# Patient Record
Sex: Female | Born: 1995 | Race: White | Hispanic: No | Marital: Single | Smoking: Current every day smoker
Health system: Southern US, Community
[De-identification: ages and names within clinical notes are randomized; demographics above are authoritative.]

## PROBLEM LIST (undated history)

## (undated) DIAGNOSIS — S43491A Other sprain of right shoulder joint, initial encounter: Secondary | ICD-10-CM

## (undated) DIAGNOSIS — F909 Attention-deficit hyperactivity disorder, unspecified type: Secondary | ICD-10-CM

## (undated) DIAGNOSIS — G44209 Tension-type headache, unspecified, not intractable: Secondary | ICD-10-CM

## (undated) DIAGNOSIS — Z8619 Personal history of other infectious and parasitic diseases: Secondary | ICD-10-CM

## (undated) DIAGNOSIS — L818 Other specified disorders of pigmentation: Secondary | ICD-10-CM

## (undated) HISTORY — PX: ROTATOR CUFF REPAIR: SHX139

## (undated) HISTORY — PX: SHOULDER ARTHROSCOPY W/ ROTATOR CUFF REPAIR: SHX2400

## (undated) HISTORY — PX: WISDOM TOOTH EXTRACTION: SHX21

---
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
## 2015-01-16 ENCOUNTER — Encounter: Payer: Self-pay | Admitting: Advanced Practice Midwife

## 2015-01-16 NOTE — Progress Notes (Signed)
  Lost her medicaid.  3 samples of Nuva Ring given

## 2015-03-02 ENCOUNTER — Ambulatory Visit: Payer: Medicaid Other | Admitting: Women's Health

## 2015-03-02 ENCOUNTER — Encounter: Payer: Self-pay | Admitting: *Deleted

## 2015-03-17 ENCOUNTER — Ambulatory Visit (INDEPENDENT_AMBULATORY_CARE_PROVIDER_SITE_OTHER): Payer: 59 | Admitting: Women's Health

## 2015-03-17 ENCOUNTER — Encounter: Payer: Self-pay | Admitting: Women's Health

## 2015-03-17 VITALS — BP 102/68 | HR 74 | Ht 62.0 in | Wt 186.0 lb

## 2015-03-17 DIAGNOSIS — Z308 Encounter for other contraceptive management: Secondary | ICD-10-CM

## 2015-03-17 DIAGNOSIS — Z3202 Encounter for pregnancy test, result negative: Secondary | ICD-10-CM | POA: Diagnosis not present

## 2015-03-17 DIAGNOSIS — Z3009 Encounter for other general counseling and advice on contraception: Secondary | ICD-10-CM

## 2015-03-17 DIAGNOSIS — Z30017 Encounter for initial prescription of implantable subdermal contraceptive: Secondary | ICD-10-CM

## 2015-03-17 LAB — POCT URINE PREGNANCY: PREG TEST UR: NEGATIVE

## 2015-03-17 NOTE — Patient Instructions (Signed)
NO SEX AT ALL UNTIL YOUR NEXPLANON IS PLACED COME 4/7 AM FOR BLOODWORK ACROSS THE HALL AT [facility], THEN IN THE AFTERNOON TO HAVE YOUR NEXPLANON PUT IN  Etonogestrel implant- NEXPLANON What is this medicine? ETONOGESTREL (et oh noe JES trel) is a contraceptive (birth control) device. It is used to prevent pregnancy. It can be used for up to 3 years. This medicine may be used for other purposes; ask your health care provider or pharmacist if you have questions. COMMON BRAND NAME(S): Implanon, Nexplanon What should I tell my health care provider before I take this medicine? They need to know if you have any of these conditions: -abnormal vaginal bleeding -blood vessel disease or blood clots -cancer of the breast, cervix, or liver -depression -diabetes -gallbladder disease -headaches -heart disease or recent heart attack -high blood pressure -high cholesterol -kidney disease -liver disease -renal disease -seizures -tobacco smoker -an unusual or allergic reaction to etonogestrel, other hormones, anesthetics or antiseptics, medicines, foods, dyes, or preservatives -pregnant or trying to get pregnant -breast-feeding How should I use this medicine? This device is inserted just under the skin on the inner side of your upper arm by a health care professional. Talk to your pediatrician regarding the use of this medicine in children. Special care may be needed. Overdosage: If you think you've taken too much of this medicine contact a poison control center or emergency room at once. Overdosage: If you think you have taken too much of this medicine contact a poison control center or emergency room at once. NOTE: This medicine is only for you. Do not share this medicine with others. What if I miss a dose? This does not apply. What may interact with this medicine? Do not take this medicine with any of the following medications: -amprenavir -bosentan -fosamprenavir This medicine may also  interact with the following medications: -barbiturate medicines for inducing sleep or treating seizures -certain medicines for fungal infections like ketoconazole and itraconazole -griseofulvin -medicines to treat seizures like carbamazepine, felbamate, oxcarbazepine, phenytoin, topiramate -modafinil -phenylbutazone -rifampin -some medicines to treat HIV infection like atazanavir, indinavir, lopinavir, nelfinavir, tipranavir, ritonavir -St. John's wort This list may not describe all possible interactions. Give your health care provider a list of all the medicines, herbs, non-prescription drugs, or dietary supplements you use. Also tell them if you smoke, drink alcohol, or use illegal drugs. Some items may interact with your medicine. What should I watch for while using this medicine? This product does not protect you against HIV infection (AIDS) or other sexually transmitted diseases. You should be able to feel the implant by pressing your fingertips over the skin where it was inserted. Tell your doctor if you cannot feel the implant. What side effects may I notice from receiving this medicine? Side effects that you should report to your doctor or health care professional as soon as possible: -allergic reactions like skin rash, itching or hives, swelling of the face, lips, or tongue -breast lumps -changes in vision -confusion, trouble speaking or understanding -dark urine -depressed mood -general ill feeling or flu-like symptoms -light-colored stools -loss of appetite, nausea -right upper belly pain -severe headaches -severe pain, swelling, or tenderness in the abdomen -shortness of breath, chest pain, swelling in a leg -signs of pregnancy -sudden numbness or weakness of the face, arm or leg -trouble walking, dizziness, loss of balance or coordination -unusual vaginal bleeding, discharge -unusually weak or tired -yellowing of the eyes or skin Side effects that usually do not  require medical attention (Report  these to your doctor or health care professional if they continue or are bothersome.): -acne -breast pain -changes in weight -cough -fever or chills -headache -irregular menstrual bleeding -itching, burning, and vaginal discharge -pain or difficulty passing urine -sore throat This list may not describe all possible side effects. Call your doctor for medical advice about side effects. You may report side effects to FDA at 1-800-FDA-1088. Where should I keep my medicine? This drug is given in a hospital or clinic and will not be stored at home. NOTE: This sheet is a summary. It may not cover all possible information. If you have questions about this medicine, talk to your doctor, pharmacist, or health care provider.  2015, Elsevier/Gold Standard. (2012-06-10 15:37:45)

## 2015-03-17 NOTE — Progress Notes (Signed)
Patient ID: Kristen Holmes Emily, female   DOB: 06-Feb-1996, 19 y.o.   MRN: [id number]   [facility]  Patient name: Kristen Holmes Kerman MRN [id number]  Date of birth: 06-Feb-1996  CC & HPI:  Kristen Holmes Hedeen is a 19 y.o. Caucasian female presenting today for nexplanon insertion. However, she had sex 2 days ago. She does have nuva ring in but has not been taking out/putting new one in on schedule like she is supposed to. Patient's last menstrual period was 02/11/2015 (approximate).   Pertinent History Reviewed:  Medical & Surgical Hx:   Past Medical History  Diagnosis Date  . Depression   . Bipolar 1 disorder   . ADHD (attention deficit hyperactivity disorder)   . Gonorrhea   . Chlamydia   . Chlamydia infection 12/03/2014  . GC (gonococcus infection) 12/03/2014  . HPV (human papilloma virus) infection    Past Surgical History  Procedure Laterality Date  . Wisdom tooth extraction     Medications: Reviewed & Updated - see associated section Social History: Reviewed -  reports that she has been smoking Cigarettes.  She has been smoking about 0.50 packs per day. She has never used smokeless tobacco.  Objective Findings:  Vitals: BP 102/68 mmHg  Pulse 74  Ht 5\' 2"  (1.575 m)  Wt 186 lb (84.369 kg)  BMI 34.01 kg/m2  LMP 02/11/2015 (Approximate)  Physical Examination: General appearance - alert, well appearing, and in no distress  Results for orders placed or performed in visit on 03/17/15 (from the past 24 hour(s))  POCT urine pregnancy   Collection Time: 03/17/15 11:40 AM  Result Value Ref Range   Preg Test, Ur Negative      Assessment & Plan:  A:   Contraception counseling P:  Abstinence until nexplanon placment   F/U 4/7 for [facility] am and nexplanon placement pm   Marge DuncansBooker, Serenidy Waltz Randall CNM, [facility] LLCWHNP-BC 03/17/2015 11:56 AM

## 2015-03-25 ENCOUNTER — Other Ambulatory Visit: Payer: 59

## 2015-03-25 ENCOUNTER — Encounter: Payer: Self-pay | Admitting: Advanced Practice Midwife

## 2015-03-25 ENCOUNTER — Encounter: Payer: 59 | Admitting: Advanced Practice Midwife

## 2015-03-25 ENCOUNTER — Ambulatory Visit (INDEPENDENT_AMBULATORY_CARE_PROVIDER_SITE_OTHER): Payer: 59 | Admitting: Advanced Practice Midwife

## 2015-03-25 DIAGNOSIS — Z3202 Encounter for pregnancy test, result negative: Secondary | ICD-10-CM | POA: Diagnosis not present

## 2015-03-25 DIAGNOSIS — Z3049 Encounter for surveillance of other contraceptives: Secondary | ICD-10-CM | POA: Diagnosis not present

## 2015-03-25 DIAGNOSIS — Z113 Encounter for screening for infections with a predominantly sexual mode of transmission: Secondary | ICD-10-CM

## 2015-03-25 DIAGNOSIS — Z30018 Encounter for initial prescription of other contraceptives: Secondary | ICD-10-CM | POA: Diagnosis not present

## 2015-03-25 DIAGNOSIS — Z3200 Encounter for pregnancy test, result unknown: Secondary | ICD-10-CM

## 2015-03-25 DIAGNOSIS — Z30017 Encounter for initial prescription of implantable subdermal contraceptive: Secondary | ICD-10-CM

## 2015-03-25 LAB — POCT URINE PREGNANCY: PREG TEST UR: NEGATIVE

## 2015-03-25 NOTE — Addendum Note (Signed)
Addended by: Richardson ChiquitoRAVIS, Donyell Ding M on: 03/25/2015 03:38 PM   Modules accepted: Orders

## 2015-03-25 NOTE — Progress Notes (Signed)
  HPI:  Kristen Holmes is a 19 y.o. year old Caucasian female here for Nexplanon insertion.  Her LMP was one month ago. She has been on nuva ring this month, had intercourse with a condom 2 weeks ago, and her pregnancy test today was negative.  Risks/benefits/side effects of Nexplanon have been discussed and her questions have been answered.  Specifically, a failure rate of 12/998 has been reported, with an increased failure rate if pt takes St. John's Wort and/or antiseizure medicaitons.  Kristen Holmes is aware of the common side effect of irregular bleeding, which the incidence of decreases over time. She requests "all" STD testing today.   Past Medical History: Past Medical History  Diagnosis Date  . Depression   . Bipolar 1 disorder   . ADHD (attention deficit hyperactivity disorder)   . Gonorrhea   . Chlamydia   . Chlamydia infection 12/03/2014  . GC (gonococcus infection) 12/03/2014  . HPV (human papilloma virus) infection     Past Surgical History: Past Surgical History  Procedure Laterality Date  . Wisdom tooth extraction      Family History: Family History  Problem Relation Age of Onset  . Depression Mother   . Alcohol abuse Maternal Aunt   . Alcohol abuse Paternal Aunt   . Drug abuse Paternal Aunt   . Mental illness Paternal Uncle   . Diabetes Maternal Grandmother     Social History: History  Substance Use Topics  . Smoking status: Current Every Day Smoker -- 0.50 packs/day    Types: Cigarettes  . Smokeless tobacco: Never Used  . Alcohol Use: Yes     Comment: Occasionally    Allergies:  Allergies  Allergen Reactions  . Aripiprazole Anxiety    Causes restlessness  . Trazodone And Nefazodone Anxiety    Causes restlessness      Her left arm, approximatly 4 inches proximal from the elbow, was cleansed with alcohol and anesthetized with 2cc of 2% Lidocaine.  The area was cleansed again and the Nexplanon was inserted without difficulty.  A pressure bandage  was applied.  Pt was instructed to remove pressure bandage in a few hours, and keep insertion site covered with a bandaid for 3 days.  Back up contraception was recommended for 2 weeks.(and condoms for STD protection always).  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

## 2015-03-26 LAB — HSV 2 ANTIBODY, IGG: HSV 2 Glycoprotein G Ab, IgG: <0.91 index (ref 0.00–0.90)

## 2015-03-26 LAB — RPR: RPR Ser Ql: NONREACTIVE

## 2015-03-26 LAB — GC/CHLAMYDIA PROBE AMP
Chlamydia trachomatis, NAA: NEGATIVE
Neisseria gonorrhoeae by PCR: NEGATIVE

## 2015-03-26 LAB — HIV ANTIBODY (ROUTINE TESTING W REFLEX): HIV SCREEN 4TH GENERATION: NONREACTIVE

## 2015-03-28 LAB — TRICHOMONAS VAGINALIS, PROBE AMP: TRICH VAG BY NAA: NEGATIVE

## 2015-04-13 ENCOUNTER — Emergency Department (HOSPITAL_COMMUNITY): Payer: 59

## 2015-04-13 ENCOUNTER — Emergency Department (HOSPITAL_COMMUNITY)
Admission: EM | Admit: 2015-04-13 | Discharge: 2015-04-13 | Disposition: A | Discharge status: Home / Self Care | Payer: 59 | Attending: Emergency Medicine | Admitting: Emergency Medicine

## 2015-04-13 ENCOUNTER — Encounter (HOSPITAL_COMMUNITY): Payer: Self-pay

## 2015-04-13 DIAGNOSIS — Y9289 Other specified places as the place of occurrence of the external cause: Secondary | ICD-10-CM | POA: Insufficient documentation

## 2015-04-13 DIAGNOSIS — S43015A Anterior dislocation of left humerus, initial encounter: Secondary | ICD-10-CM | POA: Insufficient documentation

## 2015-04-13 DIAGNOSIS — Y998 Other external cause status: Secondary | ICD-10-CM | POA: Insufficient documentation

## 2015-04-13 DIAGNOSIS — X58XXXA Exposure to other specified factors, initial encounter: Secondary | ICD-10-CM | POA: Insufficient documentation

## 2015-04-13 DIAGNOSIS — S43004A Unspecified dislocation of right shoulder joint, initial encounter: Secondary | ICD-10-CM

## 2015-04-13 DIAGNOSIS — Z8619 Personal history of other infectious and parasitic diseases: Secondary | ICD-10-CM | POA: Diagnosis not present

## 2015-04-13 DIAGNOSIS — Z8659 Personal history of other mental and behavioral disorders: Secondary | ICD-10-CM | POA: Diagnosis not present

## 2015-04-13 DIAGNOSIS — S4991XA Unspecified injury of right shoulder and upper arm, initial encounter: Secondary | ICD-10-CM | POA: Diagnosis present

## 2015-04-13 DIAGNOSIS — Z79899 Other long term (current) drug therapy: Secondary | ICD-10-CM | POA: Diagnosis not present

## 2015-04-13 DIAGNOSIS — Y9389 Activity, other specified: Secondary | ICD-10-CM | POA: Insufficient documentation

## 2015-04-13 DIAGNOSIS — Z720 Tobacco use: Secondary | ICD-10-CM | POA: Insufficient documentation

## 2015-04-13 MED ORDER — ETOMIDATE 2 MG/ML IV SOLN
12.0000 mg | Freq: Once | INTRAVENOUS | Status: AC
  Administered 2015-04-13: 12 mg via INTRAVENOUS
  Filled 2015-04-13: qty 10

## 2015-04-13 MED ORDER — HYDROMORPHONE HCL 1 MG/ML IJ SOLN
1.0000 mg | Freq: Once | INTRAMUSCULAR | Status: AC
  Administered 2015-04-13: 1 mg via INTRAVENOUS
  Filled 2015-04-13: qty 1

## 2015-04-13 MED ORDER — ONDANSETRON HCL 4 MG/2ML IJ SOLN
4.0000 mg | Freq: Once | INTRAMUSCULAR | Status: AC
  Administered 2015-04-13: 4 mg via INTRAVENOUS
  Filled 2015-04-13: qty 2

## 2015-04-13 NOTE — ED Provider Notes (Signed)
CSN: [id number]     Arrival date & time 04/13/15  1634 History   First MD Initiated Contact with Patient 04/13/15 1645     Chief Complaint  Patient presents with  . Shoulder Pain     (Consider location/radiation/quality/duration/timing/severity/associated sxs/prior Treatment) Patient is a 19 y.o. female presenting with shoulder pain. The history is provided by the patient (the pt moved her right shoulder and it came out of scoket.  this has happened before).  Shoulder Pain Location:  Shoulder (pain in right shoulder) Shoulder location:  R shoulder Pain details:    Quality:  Aching   Radiates to:  Does not radiate   Severity:  Moderate   Onset quality:  Sudden   Timing:  Constant Chronicity:  Recurrent Associated symptoms: no back pain and no fatigue     Past Medical History  Diagnosis Date  . Depression   . Bipolar 1 disorder   . ADHD (attention deficit hyperactivity disorder)   . Gonorrhea   . Chlamydia   . Chlamydia infection 12/03/2014  . GC (gonococcus infection) 12/03/2014  . HPV (human papilloma virus) infection    Past Surgical History  Procedure Laterality Date  . Wisdom tooth extraction     Family History  Problem Relation Age of Onset  . Depression Mother   . Alcohol abuse Maternal Aunt   . Alcohol abuse Paternal Aunt   . Drug abuse Paternal Aunt   . Mental illness Paternal Uncle   . Diabetes Maternal Grandmother    History  Substance Use Topics  . Smoking status: Current Every Day Smoker -- 0.50 packs/day    Types: Cigarettes  . Smokeless tobacco: Never Used  . Alcohol Use: Yes     Comment: Occasionally   OB History    No data available     Review of Systems  Constitutional: Negative for appetite change and fatigue.  HENT: Negative for congestion, ear discharge and sinus pressure.   Eyes: Negative for discharge.  Respiratory: Negative for cough.   Cardiovascular: Negative for chest pain.  Gastrointestinal: Negative for abdominal pain and  diarrhea.  Genitourinary: Negative for frequency and hematuria.  Musculoskeletal: Negative for back pain.       Shoulder pain  Skin: Negative for rash.  Neurological: Negative for seizures and headaches.  Psychiatric/Behavioral: Negative for hallucinations.      Allergies  Aripiprazole and Trazodone and nefazodone  Home Medications   Prior to Admission medications   Medication Sig Start Date End Date Taking? Authorizing Provider  etonogestrel (NEXPLANON) 68 MG IMPL implant 1 each by Subdermal route once.   Yes Historical Provider, MD  phentermine (ADIPEX-P) 37.5 MG tablet Take 37.5 mg by mouth daily before breakfast.   Yes Historical Provider, MD  azithromycin (ZITHROMAX) 500 MG tablet Take 2 po now Patient not taking: Reported on 03/17/2015 12/03/14   Adline Potter, NP  fluconazole (DIFLUCAN) 150 MG tablet 1 po stat; repeat in 3 days Patient not taking: Reported on 03/17/2015 12/13/14   Jacklyn Shell, CNM   BP 103/89 mmHg  Pulse 77  Temp(Src) 98.5 F (36.9 C) (Oral)  Resp 17  Ht  (1.575 m)  Wt 179 lb 8 oz (81.421 kg)  BMI 32.82 kg/m2  SpO2 100%  LMP 02/11/2015 (Approximate) Physical Exam  Constitutional: She is oriented to person, place, and time. She appears well-developed.  HENT:  Head: Normocephalic.  Eyes: Conjunctivae and EOM are normal. No scleral icterus.  Neck: Neck supple. No thyromegaly present.  Cardiovascular: Normal  rate and regular rhythm.  Exam reveals no gallop and no friction rub.   No murmur heard. Pulmonary/Chest: No stridor. She has no wheezes. She has no rales. She exhibits no tenderness.  Abdominal: She exhibits no distension. There is no tenderness. There is no rebound.  Musculoskeletal: She exhibits no edema.  Deformed tender shoulder  Lymphadenopathy:    She has no cervical adenopathy.  Neurological: She is oriented to person, place, and time. She exhibits normal muscle tone. Coordination normal.  Skin: No rash noted. No  erythema.  Psychiatric: She has a normal mood and affect. Her behavior is normal.    ED Course  Procedures (including critical care time) Labs Review Labs Reviewed - No data to display  Imaging Review Dg Shoulder Right  04/13/2015   CLINICAL DATA:  Pain with history of anterior shoulder dislocations  EXAM: RIGHT SHOULDER - 2+ VIEW  COMPARISON:  October 23, 2014 and October 15, 2013  FINDINGS: Frontal and Y scapular images show anterior dislocation, subcoracoid. No fracture apparent. No appreciable arthropathic change.  IMPRESSION: Anterior dislocation.   Electronically Signed   By: Bretta BangWilliam  Woodruff III M.D.   On: 04/13/2015 17:55   Dg Shoulder Right Port  04/13/2015   CLINICAL DATA:  Post reduction from anterior dislocation  EXAM: PORTABLE RIGHT SHOULDER - 2+ VIEW  COMPARISON:  Study obtained earlier in the day  FINDINGS: Frontal and Y scapular images were obtained. The previously noted anterior dislocation has been reduced successfully. No fracture or dislocation seen currently. No appreciable arthropathy.  IMPRESSION: Successful reduction of anterior dislocation. Currently no fracture or dislocation.   Electronically Signed   By: Bretta BangWilliam  Woodruff III M.D.   On: 04/13/2015 19:54     EKG Interpretation None     Procedure.   Etomidate used for concious sedation.  No complications.   Right shoulder dislocation reduced without problems MDM   Final diagnoses:  Dislocated shoulder, right, initial encounter        Bethann BerkshireJoseph Ramir Malerba, MD 04/13/15 2015

## 2015-04-13 NOTE — ED Notes (Signed)
Pts sedation has ended. VSS. Right arm is in a sling. Neurovascular check WNL. Pt verbalizes discharge instuctions. Pt is ambulatory.

## 2015-04-13 NOTE — ED Notes (Signed)
Pt reports has problems with r shoulder becoming dislocated.  Reports was pushing herself up off of the couch and felt r shoulder pop.  C/O Pain.  EMS applied sling.

## 2015-04-13 NOTE — ED Notes (Signed)
X-ray at bedside. Pt family at bedside.

## 2015-04-13 NOTE — Discharge Instructions (Signed)
Follow up with dr. Romeo AppleHarrison in 2-3 days.  Tylenol or motrin for pain

## 2015-04-13 NOTE — ED Notes (Signed)
Pt left ED, ambulatory with no signs of distress. She verbalized discharge instructions. 

## 2015-04-14 ENCOUNTER — Telehealth: Payer: Self-pay | Admitting: Orthopedic Surgery

## 2015-04-14 NOTE — Telephone Encounter (Signed)
Patient called requesting an appointment for an [facility] ER FU with Dr. Romeo AppleHarrison for right shoulder dislocation. [facility] which requires a PCP Referral and patient states she does not have a PCP and I explained to her and her mother both that [facility] her. I told mom and daughter both that as soon as they have insurance referral we would be glad to schedule appointment. Mother was not happy with this news but I tried to explain to her that is the type of [employer] she as signed up for.

## 2015-04-21 NOTE — Telephone Encounter (Signed)
Appointment scheduled, patient aware, referrals in processing.

## 2015-05-03 ENCOUNTER — Encounter: Payer: Self-pay | Admitting: Orthopedic Surgery

## 2015-05-03 ENCOUNTER — Ambulatory Visit (INDEPENDENT_AMBULATORY_CARE_PROVIDER_SITE_OTHER): Payer: 59 | Admitting: Orthopedic Surgery

## 2015-05-03 VITALS — BP 107/66 | Ht 62.0 in | Wt 179.0 lb

## 2015-05-03 DIAGNOSIS — M24411 Recurrent dislocation, right shoulder: Secondary | ICD-10-CM

## 2015-05-03 NOTE — Progress Notes (Signed)
Patient ID: Kristen Holmes, female   DOB: 06/05/1996, 19 y.o.   MRN: [id number]  Chief Complaint  Patient presents with  . Follow-up    er follow up Right shoulder dislocation, DOI 04/13/15     Kristen Holmes is a 19 y.o. female.   HPI This is an 19 year old female presents with a history of 67 dislocation of the right shoulder. He now goes out easily. She had a closed reduction back in April. She comes in for evaluation and treatment complaining of mild shoulder discomfort. She has worn a sling for her injuries up to 3 weeks' time she's never had physical therapy or MRI. Her dislocating position his abduction external rotation or pushing herself up out of a chair. She denies symptoms on the left side. She denies numbness or tingling. Healthy system review negative Review of Systems Negative  Past Medical History  Diagnosis Date  . Depression   . Bipolar 1 disorder   . ADHD (attention deficit hyperactivity disorder)   . Gonorrhea   . Chlamydia   . Chlamydia infection 12/03/2014  . GC (gonococcus infection) 12/03/2014  . HPV (human papilloma virus) infection     Past Surgical History  Procedure Laterality Date  . Wisdom tooth extraction      Family History  Problem Relation Age of Onset  . Depression Mother   . Alcohol abuse Maternal Aunt   . Alcohol abuse Paternal Aunt   . Drug abuse Paternal Aunt   . Mental illness Paternal Uncle   . Diabetes Maternal Grandmother     Social History History  Substance Use Topics  . Smoking status: Current Every Day Smoker -- 0.50 packs/day    Types: Cigarettes  . Smokeless tobacco: Never Used  . Alcohol Use: Yes     Comment: Occasionally    Allergies  Allergen Reactions  . Aripiprazole Anxiety    Causes restlessness  . Trazodone And Nefazodone Anxiety    Causes restlessness    Current Outpatient Prescriptions  Medication Sig Dispense Refill  . etonogestrel (NEXPLANON) 68 MG IMPL implant 1 each by Subdermal route once.      No current facility-administered medications for this visit.       Physical Exam Blood pressure 107/66, height 5\' 2"  (1.575 m), weight 179 lb (81.194 kg). Physical Exam The patient is well developed well nourished and well groomed. Orientation to person place and time is normal  Mood is pleasant. Ambulatory status normal Left shoulder exhibits inferior subluxation negative apprehension normal range of motion normal rotator cuff strength scans normal neurologic and vascular exam normal. Flexing the wrist or thumb reaches the forearm she has ligament laxity  Right shoulder is difficult for examination she has severe apprehension abduction external rotation. She has inferior sulcus sign. Neurovascular exam is intact skin is normal rotator cuff strength is normal range of motion assessment deferred  Data Reviewed Previous imaging studies show no Hill-Sachs lesion  Assessment Recurrent shoulder dislocation Plan MRI right shoulder with contrast imaging 2 plan surgical reconstruction

## 2015-05-03 NOTE — Patient Instructions (Signed)
Ref[place] to [facility]ist in [place].  [facility] at [facility].

## 2015-05-19 ENCOUNTER — Ambulatory Visit (HOSPITAL_COMMUNITY)
Admission: RE | Admit: 2015-05-19 | Discharge: 2015-05-19 | Disposition: A | Discharge status: Home / Self Care | Payer: 59 | Source: Ambulatory Visit | Attending: Orthopedic Surgery | Admitting: Orthopedic Surgery

## 2015-05-19 ENCOUNTER — Other Ambulatory Visit: Payer: Self-pay | Admitting: Orthopedic Surgery

## 2015-05-19 ENCOUNTER — Encounter (HOSPITAL_COMMUNITY): Payer: Self-pay

## 2015-05-19 DIAGNOSIS — M24411 Recurrent dislocation, right shoulder: Secondary | ICD-10-CM

## 2015-05-19 MED ORDER — GADOBENATE DIMEGLUMINE 529 MG/ML IV SOLN
5.0000 mL | Freq: Once | INTRAVENOUS | Status: AC | PRN
  Administered 2015-05-19: 0.05 mL via INTRAVENOUS

## 2015-05-19 MED ORDER — IOHEXOL 300 MG/ML  SOLN
50.0000 mL | Freq: Once | INTRAMUSCULAR | Status: AC | PRN
  Administered 2015-05-19: 9 mL via INTRAVENOUS

## 2015-05-19 MED ORDER — LIDOCAINE HCL (PF) 1 % IJ SOLN
INTRAMUSCULAR | Status: AC
  Filled 2015-05-19: qty 5

## 2015-05-24 ENCOUNTER — Telehealth: Payer: Self-pay | Admitting: Orthopedic Surgery

## 2015-05-24 NOTE — Telephone Encounter (Signed)
error 

## 2015-05-25 ENCOUNTER — Telehealth: Payer: Self-pay | Admitting: *Deleted

## 2015-05-25 NOTE — Telephone Encounter (Signed)
DR Romeo AppleHARRISON RECOMMENDS REFERRAL TO SHOULDER SPECIALIST DR LANDAU   PATIENTS INSURANCE REQUIRES REFERRAL FROM PCP  FAXED RECOMMENDATION WITH OFFICE NOTES TO PCP

## 2015-05-26 ENCOUNTER — Encounter: Payer: Self-pay | Admitting: *Deleted

## 2015-05-26 NOTE — Telephone Encounter (Signed)
This encounter was created in error - please disregard.

## 2015-06-25 ENCOUNTER — Encounter: Payer: 59 | Admitting: Adult Health

## 2015-07-08 ENCOUNTER — Other Ambulatory Visit: Payer: Self-pay | Admitting: Advanced Practice Midwife

## 2015-07-08 MED ORDER — CIPROFLOXACIN-DEXAMETHASONE 0.3-0.1 % OT SUSP: 4.0000 [drp] | Freq: Two times a day (BID) | OTIC | Status: DC

## 2015-07-08 NOTE — Progress Notes (Signed)
Ear pain. Hx ear infections.  Rx ciprodex

## 2015-07-19 DIAGNOSIS — S43431A Superior glenoid labrum lesion of right shoulder, initial encounter: Secondary | ICD-10-CM

## 2015-07-19 DIAGNOSIS — S43491A Other sprain of right shoulder joint, initial encounter: Secondary | ICD-10-CM

## 2015-07-19 HISTORY — DX: Superior glenoid labrum lesion of right shoulder, initial encounter: S43.431A

## 2015-07-19 HISTORY — DX: Other sprain of right shoulder joint, initial encounter: S43.491A

## 2015-07-20 ENCOUNTER — Encounter: Payer: Self-pay | Admitting: Adult Health

## 2015-07-20 ENCOUNTER — Ambulatory Visit (INDEPENDENT_AMBULATORY_CARE_PROVIDER_SITE_OTHER): Payer: Medicaid Other | Admitting: Adult Health

## 2015-07-20 VITALS — BP 120/80 | HR 80 | Ht 63.0 in | Wt 194.5 lb

## 2015-07-20 DIAGNOSIS — Z3202 Encounter for pregnancy test, result negative: Secondary | ICD-10-CM | POA: Diagnosis not present

## 2015-07-20 DIAGNOSIS — Z309 Encounter for contraceptive management, unspecified: Secondary | ICD-10-CM | POA: Insufficient documentation

## 2015-07-20 DIAGNOSIS — Z113 Encounter for screening for infections with a predominantly sexual mode of transmission: Secondary | ICD-10-CM

## 2015-07-20 DIAGNOSIS — Z3049 Encounter for surveillance of other contraceptives: Secondary | ICD-10-CM

## 2015-07-20 DIAGNOSIS — Z3046 Encounter for surveillance of implantable subdermal contraceptive: Secondary | ICD-10-CM

## 2015-07-20 DIAGNOSIS — Z30018 Encounter for initial prescription of other contraceptives: Secondary | ICD-10-CM

## 2015-07-20 LAB — POCT URINE PREGNANCY: Preg Test, Ur: NEGATIVE

## 2015-07-20 MED ORDER — ETONOGESTREL-ETHINYL ESTRADIOL 0.12-0.015 MG/24HR VA RING: VAGINAL_RING | VAGINAL | Status: DC

## 2015-07-20 MED ORDER — MISOPROSTOL 200 MCG PO TABS: ORAL_TABLET | ORAL | Status: DC

## 2015-07-20 NOTE — Progress Notes (Signed)
Subjective:     Patient ID: Kristen Holmes, female   DOB: 1996-09-04, 19 y.o.   MRN: [id number]  HPI Kristen Holmes is a 19 year old white female, in for nexplanon removal, she has gained 15 lbs since insertion in April, she says and it is making her mean.She thinks she wants para guard IUD.And she requests nuva ring til then and STD testing today.Last sex 2 weeks ago.  Review of Systems Patient denies any headaches, hearing loss, fatigue, blurred vision, shortness of breath, chest pain, abdominal pain, problems with bowel movements, urination, or intercourse. No joint pain or mood swings.See HPI for positives.  Reviewed past medical,surgical, social and family history. Reviewed medications and allergies.     Objective:   Physical Exam BP 120/80 mmHg  Pulse 80  Ht  (1.6 m)  Wt 194 lb 8 oz (88.225 kg)  BMI 34.46 kg/m2 UPT negative,  Consent signed, time out called, left arm cleansed with betadine, and injected with 1.5 cc 2% lidocaine and waited til numb.Under sterile technique a #11 blade was used to make small vertical incision, and a curved forceps was used to easily remove rod. Steri strips applied. Pressure dressing applied.    Assessment:     Nexplanon removal Contraceptive management    STD screening  Plan:      Keep clean and dry x 24 hours, no heavy lifting, keep steri strips on x 72 hours, Keep pressure dressing on x 24 hours. Follow up prn problems. Rx nuva ring x 1 sample given to insert today, lot [id number] exp 5/18 Order para guard IUD Return 8/23 in am for stat [facility] at 8 am and then take cytotec at 1 pm if test negative and then return at 4 pm for IUD insertion with Kristen Holmes Rx cytotec 200 mcg # 2 take 2 at 1 pm on 8/23 if [facility] negative Review handout on IUD Check GC/CHL,HIV,RPR and HSV2   No sex til after IUD inserted, call if period starts

## 2015-07-20 NOTE — Patient Instructions (Addendum)
Intrauterine Device Information An intrauterine device (IUD) is inserted into your uterus to prevent pregnancy. There are two types of IUDs available:   Copper IUD--This type of IUD is wrapped in copper wire and is placed inside the uterus. Copper makes the uterus and fallopian tubes produce a fluid that kills sperm. The copper IUD can stay in place for 10 years.  Hormone IUD--This type of IUD contains the hormone progestin (synthetic progesterone). The hormone thickens the cervical mucus and prevents sperm from entering the uterus. It also thins the uterine lining to prevent implantation of a fertilized egg. The hormone can weaken or kill the sperm that get into the uterus. One type of hormone IUD can stay in place for 5 years, and another type can stay in place for 3 years. Your health care provider will make sure you are a good candidate for a contraceptive IUD. Discuss with your health care provider the possible side effects.  ADVANTAGES OF AN INTRAUTERINE DEVICE  IUDs are highly effective, reversible, long acting, and low maintenance.   There are no estrogen-related side effects.   An IUD can be used when breastfeeding.   IUDs are not associated with weight gain.   The copper IUD works immediately after insertion.   The hormone IUD works right away if inserted within 7 days of your period starting. You will need to use a backup method of birth control for 7 days if the hormone IUD is inserted at any other time in your cycle.  The copper IUD does not interfere with your female hormones.   The hormone IUD can make heavy menstrual periods lighter and decrease cramping.   The hormone IUD can be used for 3 or 5 years.   The copper IUD can be used for 10 years. DISADVANTAGES OF AN INTRAUTERINE DEVICE  The hormone IUD can be associated with irregular bleeding patterns.   The copper IUD can make your menstrual flow heavier and more painful.   You may experience cramping and  vaginal bleeding after insertion.  Document Released: 11/07/2004 Document Revised: 08/06/2013 Document Reviewed: 05/25/2013 [facility] Patient Information 2015 [place], [place]. This information is not intended to replace advice given to you by your health care provider. Make sure you discuss any questions you have with your health care provider. Put nuva ring in today Return 8/23 stat QHCG at 8 am and then take cytotec at 1 PM if test negative, then return at 4 pm for IUD insertion No sex, til after IUD inserted.If gets period call , keep clean and dry x 24 hours, no heavy lifting, keep steri strips on x 72 hours, Keep pressure dressing on x 24 hours. Follow up prn problems.

## 2015-07-21 ENCOUNTER — Telehealth: Payer: Self-pay | Admitting: Adult Health

## 2015-07-21 DIAGNOSIS — Z8619 Personal history of other infectious and parasitic diseases: Secondary | ICD-10-CM

## 2015-07-21 HISTORY — DX: Personal history of other infectious and parasitic diseases: Z86.19

## 2015-07-21 LAB — GC/CHLAMYDIA PROBE AMP
CHLAMYDIA, DNA PROBE: POSITIVE — AB
Neisseria gonorrhoeae by PCR: NEGATIVE

## 2015-07-21 MED ORDER — AZITHROMYCIN 500 MG PO TABS: ORAL_TABLET | ORAL | Status: DC

## 2015-07-21 NOTE — Telephone Encounter (Signed)
Called azithromycin 500 mg #2 2 po now for Kristen Holmes [birth year] to [facility]

## 2015-07-21 NOTE — Telephone Encounter (Signed)
Pt aware +Chlamydia, will treat with azithromycin 500 mg 2 po now and POT 8/19 at 9 am,no sex, [facility] sent, she said she would tell partner to go to clinic

## 2015-07-22 LAB — HSV 2 ANTIBODY, IGG: HSV 2 Glycoprotein G Ab, IgG: <0.91 index (ref 0.00–0.90)

## 2015-07-22 LAB — RPR: RPR Ser Ql: NONREACTIVE

## 2015-07-22 LAB — HIV ANTIBODY (ROUTINE TESTING W REFLEX): HIV Screen 4th Generation wRfx: NONREACTIVE

## 2015-08-06 ENCOUNTER — Encounter: Payer: Self-pay | Admitting: Adult Health

## 2015-08-06 ENCOUNTER — Ambulatory Visit: Payer: Medicaid Other | Admitting: Adult Health

## 2015-08-09 ENCOUNTER — Ambulatory Visit (INDEPENDENT_AMBULATORY_CARE_PROVIDER_SITE_OTHER): Payer: Medicaid Other | Admitting: Adult Health

## 2015-08-09 ENCOUNTER — Encounter: Payer: Self-pay | Admitting: Adult Health

## 2015-08-09 VITALS — BP 110/78 | HR 76 | Ht 62.5 in | Wt 200.0 lb

## 2015-08-09 DIAGNOSIS — A749 Chlamydial infection, unspecified: Secondary | ICD-10-CM

## 2015-08-09 NOTE — Progress Notes (Signed)
Subjective:     Patient ID: Kristen Holmes, female   DOB: 1996-05-01, 19 y.o.   MRN: [id number]  HPI Kristen Holmes is a 19 year old white female in for proof of treatment for recent chlamydia.  Review of Systems Patient denies any headaches, hearing loss, fatigue, blurred vision, shortness of breath, chest pain, abdominal pain, problems with bowel movements, urination, or intercourse. No joint pain or mood swings. Reviewed past medical,surgical, social and family history. Reviewed medications and allergies.     Objective:   Physical Exam BP 110/78 mmHg  Pulse 76  Ht 5' 2.5" (1.588 m)  Wt 200 lb (90.719 kg)  BMI 35.97 kg/m2  LMP talk only for 10 minutes face time, discussing getting IUD tomorrow, has nuva ring in, will send GC/CHL on urine now.    Assessment:     Recent chlamydia infection     Plan:     GC/CHL sent  She is scheduled for IUD tomorrow, get [facility] in am at 8 and call at 1 pm for results so can take Cytotec

## 2015-08-09 NOTE — Patient Instructions (Signed)
No sex, get blood in am at 8 am, call at 1 pm for results so can take cytotec

## 2015-08-10 ENCOUNTER — Ambulatory Visit: Payer: Medicaid Other | Admitting: Women's Health

## 2015-08-10 LAB — GC/CHLAMYDIA PROBE AMP
Chlamydia trachomatis, NAA: NEGATIVE
Neisseria gonorrhoeae by PCR: NEGATIVE

## 2015-08-13 ENCOUNTER — Encounter (HOSPITAL_BASED_OUTPATIENT_CLINIC_OR_DEPARTMENT_OTHER): Payer: Self-pay | Admitting: *Deleted

## 2015-08-17 ENCOUNTER — Ambulatory Visit: Payer: Self-pay | Admitting: Physician Assistant
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
## 2015-08-18 ENCOUNTER — Ambulatory Visit (HOSPITAL_BASED_OUTPATIENT_CLINIC_OR_DEPARTMENT_OTHER)
Admission: RE | Admit: 2015-08-18 | Discharge: 2015-08-18 | Disposition: A | Discharge status: Home / Self Care | Payer: Medicaid Other | Source: Ambulatory Visit | Attending: Orthopedic Surgery | Admitting: Orthopedic Surgery

## 2015-08-18 ENCOUNTER — Ambulatory Visit (HOSPITAL_BASED_OUTPATIENT_CLINIC_OR_DEPARTMENT_OTHER): Payer: Medicaid Other | Admitting: Anesthesiology

## 2015-08-18 ENCOUNTER — Encounter (HOSPITAL_BASED_OUTPATIENT_CLINIC_OR_DEPARTMENT_OTHER)
Admission: RE | Disposition: A | Discharge status: Home / Self Care | Payer: Self-pay | Source: Ambulatory Visit | Attending: Orthopedic Surgery

## 2015-08-18 ENCOUNTER — Encounter (HOSPITAL_BASED_OUTPATIENT_CLINIC_OR_DEPARTMENT_OTHER): Payer: Self-pay | Admitting: Anesthesiology

## 2015-08-18 DIAGNOSIS — M25311 Other instability, right shoulder: Secondary | ICD-10-CM | POA: Diagnosis not present

## 2015-08-18 DIAGNOSIS — F17200 Nicotine dependence, unspecified, uncomplicated: Secondary | ICD-10-CM | POA: Insufficient documentation

## 2015-08-18 DIAGNOSIS — F419 Anxiety disorder, unspecified: Secondary | ICD-10-CM | POA: Insufficient documentation

## 2015-08-18 HISTORY — PX: BANKART REPAIR: SHX5173

## 2015-08-18 HISTORY — DX: Personal history of other infectious and parasitic diseases: Z86.19

## 2015-08-18 HISTORY — DX: Other specified disorders of pigmentation: L81.8

## 2015-08-18 HISTORY — DX: Tension-type headache, unspecified, not intractable: G44.209

## 2015-08-18 HISTORY — DX: Other sprain of right shoulder joint, initial encounter: S43.491A

## 2015-08-18 SURGERY — REPAIR, SHOULDER, ARTHROSCOPIC, BANKART
Anesthesia: Regional | Site: Shoulder | Laterality: Right

## 2015-08-18 MED ORDER — CEFAZOLIN SODIUM-DEXTROSE 2-3 GM-% IV SOLR
INTRAVENOUS | Status: AC
  Filled 2015-08-18: qty 50

## 2015-08-18 MED ORDER — ONDANSETRON HCL 4 MG/2ML IJ SOLN
INTRAMUSCULAR | Status: AC
  Filled 2015-08-18: qty 2

## 2015-08-18 MED ORDER — SUCCINYLCHOLINE CHLORIDE 20 MG/ML IJ SOLN
INTRAMUSCULAR | Status: AC
Start: 2015-08-18 — End: 2015-08-18
  Filled 2015-08-18: qty 1

## 2015-08-18 MED ORDER — FENTANYL CITRATE (PF) 100 MCG/2ML IJ SOLN: 25.0000 ug | INTRAMUSCULAR | Status: DC | PRN

## 2015-08-18 MED ORDER — CHLORHEXIDINE GLUCONATE 4 % EX LIQD: 60.0000 mL | Freq: Once | CUTANEOUS | Status: DC

## 2015-08-18 MED ORDER — PROPOFOL 10 MG/ML IV BOLUS
INTRAVENOUS | Status: DC | PRN
  Administered 2015-08-18: 30 mg via INTRAVENOUS
  Administered 2015-08-18: 200 mg via INTRAVENOUS

## 2015-08-18 MED ORDER — ONDANSETRON HCL 4 MG/2ML IJ SOLN
INTRAMUSCULAR | Status: DC | PRN
  Administered 2015-08-18: 4 mg via INTRAVENOUS

## 2015-08-18 MED ORDER — PROMETHAZINE HCL 25 MG/ML IJ SOLN
6.2500 mg | INTRAMUSCULAR | Status: DC | PRN
  Administered 2015-08-18: 6.25 mg via INTRAVENOUS

## 2015-08-18 MED ORDER — CEFAZOLIN SODIUM-DEXTROSE 2-3 GM-% IV SOLR
2.0000 g | INTRAVENOUS | Status: AC
  Administered 2015-08-18: 2 g via INTRAVENOUS

## 2015-08-18 MED ORDER — LIDOCAINE HCL (CARDIAC) 20 MG/ML IV SOLN
INTRAVENOUS | Status: DC | PRN
  Administered 2015-08-18: 50 mg via INTRAVENOUS

## 2015-08-18 MED ORDER — SODIUM CHLORIDE 0.9 % IR SOLN
Status: DC | PRN
  Administered 2015-08-18: 7500 mL

## 2015-08-18 MED ORDER — PROMETHAZINE HCL 25 MG/ML IJ SOLN
INTRAMUSCULAR | Status: AC
  Filled 2015-08-18: qty 1

## 2015-08-18 MED ORDER — BUPIVACAINE-EPINEPHRINE (PF) 0.5% -1:200000 IJ SOLN
INTRAMUSCULAR | Status: DC | PRN
  Administered 2015-08-18: 25 mL via PERINEURAL

## 2015-08-18 MED ORDER — KETOROLAC TROMETHAMINE 30 MG/ML IJ SOLN
INTRAMUSCULAR | Status: AC
  Filled 2015-08-18: qty 1

## 2015-08-18 MED ORDER — METHOCARBAMOL 500 MG PO TABS
500.0000 mg | ORAL_TABLET | Freq: Four times a day (QID) | ORAL | Status: DC | PRN
Start: 2015-08-18 — End: 2017-03-13

## 2015-08-18 MED ORDER — MIDAZOLAM HCL 2 MG/2ML IJ SOLN
1.0000 mg | INTRAMUSCULAR | Status: DC | PRN
  Administered 2015-08-18: 1 mg via INTRAVENOUS

## 2015-08-18 MED ORDER — PROPOFOL 10 MG/ML IV BOLUS
INTRAVENOUS | Status: AC
  Filled 2015-08-18: qty 20

## 2015-08-18 MED ORDER — DEXAMETHASONE SODIUM PHOSPHATE 4 MG/ML IJ SOLN
INTRAMUSCULAR | Status: DC | PRN
  Administered 2015-08-18: 10 mg via INTRAVENOUS

## 2015-08-18 MED ORDER — ATROPINE SULFATE 0.4 MG/ML IJ SOLN
INTRAMUSCULAR | Status: AC
  Filled 2015-08-18: qty 1

## 2015-08-18 MED ORDER — MIDAZOLAM HCL 2 MG/2ML IJ SOLN
INTRAMUSCULAR | Status: AC
  Filled 2015-08-18: qty 4

## 2015-08-18 MED ORDER — LACTATED RINGERS IV SOLN
INTRAVENOUS | Status: DC
  Administered 2015-08-18 (×2): via INTRAVENOUS

## 2015-08-18 MED ORDER — FENTANYL CITRATE (PF) 100 MCG/2ML IJ SOLN
INTRAMUSCULAR | Status: AC
  Filled 2015-08-18: qty 2

## 2015-08-18 MED ORDER — METHYLPREDNISOLONE ACETATE 80 MG/ML IJ SUSP
INTRAMUSCULAR | Status: AC
  Filled 2015-08-18: qty 1

## 2015-08-18 MED ORDER — FENTANYL CITRATE (PF) 100 MCG/2ML IJ SOLN
INTRAMUSCULAR | Status: DC | PRN
  Administered 2015-08-18: 100 ug via INTRAVENOUS

## 2015-08-18 MED ORDER — MIDAZOLAM HCL 2 MG/2ML IJ SOLN
INTRAMUSCULAR | Status: AC
  Filled 2015-08-18: qty 2

## 2015-08-18 MED ORDER — SCOPOLAMINE 1 MG/3DAYS TD PT72: 1.0000 | MEDICATED_PATCH | Freq: Once | TRANSDERMAL | Status: DC | PRN

## 2015-08-18 MED ORDER — OXYCODONE HCL 5 MG PO TABS: 5.0000 mg | ORAL_TABLET | Freq: Once | ORAL | Status: DC | PRN

## 2015-08-18 MED ORDER — FENTANYL CITRATE (PF) 100 MCG/2ML IJ SOLN
50.0000 ug | INTRAMUSCULAR | Status: DC | PRN
  Administered 2015-08-18: 75 ug via INTRAVENOUS

## 2015-08-18 MED ORDER — LIDOCAINE HCL (CARDIAC) 20 MG/ML IV SOLN
INTRAVENOUS | Status: AC
  Filled 2015-08-18: qty 5

## 2015-08-18 MED ORDER — SUCCINYLCHOLINE CHLORIDE 20 MG/ML IJ SOLN
INTRAMUSCULAR | Status: DC | PRN
  Administered 2015-08-18: 50 mg via INTRAVENOUS

## 2015-08-18 MED ORDER — OXYCODONE HCL 5 MG/5ML PO SOLN: 5.0000 mg | Freq: Once | ORAL | Status: DC | PRN

## 2015-08-18 MED ORDER — OXYCODONE-ACETAMINOPHEN 5-325 MG PO TABS: 1.0000 | ORAL_TABLET | ORAL | Status: DC | PRN

## 2015-08-18 MED ORDER — DEXAMETHASONE SODIUM PHOSPHATE 4 MG/ML IJ SOLN
INTRAMUSCULAR | Status: AC
  Filled 2015-08-18: qty 1

## 2015-08-18 MED ORDER — MIDAZOLAM HCL 5 MG/5ML IJ SOLN
INTRAMUSCULAR | Status: DC | PRN
  Administered 2015-08-18: 2 mg via INTRAVENOUS

## 2015-08-18 MED ORDER — BUPIVACAINE-EPINEPHRINE (PF) 0.5% -1:200000 IJ SOLN
INTRAMUSCULAR | Status: AC
  Filled 2015-08-18: qty 30

## 2015-08-18 MED ORDER — GLYCOPYRROLATE 0.2 MG/ML IJ SOLN: 0.2000 mg | Freq: Once | INTRAMUSCULAR | Status: DC | PRN

## 2015-08-18 MED ORDER — KETOROLAC TROMETHAMINE 30 MG/ML IJ SOLN
INTRAMUSCULAR | Status: DC | PRN
  Administered 2015-08-18: 30 mg via INTRAVENOUS

## 2015-08-18 SURGICAL SUPPLY — 82 items
ANCHOR SUT BIOCOMP LK 2.9X12.5 (Anchor) ×6 IMPLANT
BENZOIN TINCTURE PRP APPL 2/3 (GAUZE/BANDAGES/DRESSINGS) IMPLANT
BLADE 4.2CUDA (BLADE) ×3 IMPLANT
BLADE CUTTER GATOR 3.5 (BLADE) IMPLANT
BLADE SURG 10 STRL SS (BLADE) IMPLANT
BLADE SURG 15 STRL LF DISP TIS (BLADE) ×1 IMPLANT
BLADE SURG 15 STRL SS (BLADE) ×2
BLADE VORTEX 6.0 (BLADE) IMPLANT
BUR 3.5 LG SPHERICAL (BURR) ×1 IMPLANT
BUR VERTEX HOODED 4.5 (BURR) IMPLANT
BURR 3.5 LG SPHERICAL (BURR) ×2
BURR 3.5MM LG SPHERICAL (BURR) ×1
CANNULA 5.75X71 LONG (CANNULA) IMPLANT
CANNULA SHOULDER 7CM (CANNULA) ×3 IMPLANT
CANNULA TWIST IN 8.25X7CM (CANNULA) ×3 IMPLANT
CLEANER CAUTERY TIP 5X5 PAD (MISCELLANEOUS) IMPLANT
CLOSURE WOUND 1/2 X4 (GAUZE/BANDAGES/DRESSINGS)
CUTTER MENISCUS  4.2MM (BLADE)
CUTTER MENISCUS 4.2MM (BLADE) IMPLANT
DECANTER SPIKE VIAL GLASS SM (MISCELLANEOUS) IMPLANT
DRAPE STERI 35X30 U-POUCH (DRAPES) ×3 IMPLANT
DRAPE SURG 17X23 STRL (DRAPES) ×3 IMPLANT
DRAPE U-SHAPE 47X51 STRL (DRAPES) IMPLANT
DRAPE U-SHAPE 76X120 STRL (DRAPES) ×3 IMPLANT
DRSG EMULSION OIL 3X3 NADH (GAUZE/BANDAGES/DRESSINGS) ×3 IMPLANT
DRSG PAD ABDOMINAL 8X10 ST (GAUZE/BANDAGES/DRESSINGS) ×3 IMPLANT
DURAPREP 26ML APPLICATOR (WOUND CARE) ×3 IMPLANT
ELECT REM PT RETURN 9FT ADLT (ELECTROSURGICAL) ×3
ELECTRODE REM PT RTRN 9FT ADLT (ELECTROSURGICAL) ×1 IMPLANT
FIBERSTICK 2 (SUTURE) ×3 IMPLANT
GAUZE SPONGE 4X4 12PLY STRL (GAUZE/BANDAGES/DRESSINGS) ×3 IMPLANT
GLOVE BIO SURGEON STRL SZ7.5 (GLOVE) IMPLANT
GLOVE BIOGEL PI IND STRL 8 (GLOVE) ×2 IMPLANT
GLOVE BIOGEL PI IND STRL 8.5 (GLOVE) IMPLANT
GLOVE BIOGEL PI INDICATOR 8 (GLOVE) ×4
GLOVE BIOGEL PI INDICATOR 8.5 (GLOVE)
GLOVE SURG ORTHO 8.0 STRL STRW (GLOVE) ×3 IMPLANT
GOWN STRL REUS W/ TWL LRG LVL3 (GOWN DISPOSABLE) IMPLANT
GOWN STRL REUS W/ TWL XL LVL3 (GOWN DISPOSABLE) ×1 IMPLANT
GOWN STRL REUS W/TWL LRG LVL3 (GOWN DISPOSABLE)
GOWN STRL REUS W/TWL XL LVL3 (GOWN DISPOSABLE) ×2
IV NS IRRIG 3000ML ARTHROMATIC (IV SOLUTION) ×15 IMPLANT
KIT PUSHLOCK 2.9 HIP (KITS) ×3 IMPLANT
LASSO 90 CVE QUICKPAS (DISPOSABLE) ×3 IMPLANT
MANIFOLD NEPTUNE II (INSTRUMENTS) ×3 IMPLANT
NEEDLE 1/2 CIR CATGUT .05X1.09 (NEEDLE) IMPLANT
NS IRRIG 1000ML POUR BTL (IV SOLUTION) IMPLANT
PACK ARTHROSCOPY DSU (CUSTOM PROCEDURE TRAY) ×3 IMPLANT
PACK BASIN DAY SURGERY FS (CUSTOM PROCEDURE TRAY) ×3 IMPLANT
PAD CLEANER CAUTERY TIP 5X5 (MISCELLANEOUS)
PAD ORTHO SHOULDER 7X19 LRG (SOFTGOODS) IMPLANT
PENCIL BUTTON HOLSTER BLD 10FT (ELECTRODE) IMPLANT
SET ARTHROSCOPY TUBING (MISCELLANEOUS) ×2
SET ARTHROSCOPY TUBING LN (MISCELLANEOUS) ×1 IMPLANT
SLING ARM LRG ADULT FOAM STRAP (SOFTGOODS) ×3 IMPLANT
SLING ARM MED ADULT FOAM STRAP (SOFTGOODS) IMPLANT
SLING ULTRA II MEDIUM (SOFTGOODS) IMPLANT
SLING ULTRA II SMALL (SOFTGOODS) IMPLANT
SPONGE LAP 4X18 X RAY DECT (DISPOSABLE) IMPLANT
STAPLER VISISTAT 35W (STAPLE) IMPLANT
STRIP CLOSURE SKIN 1/2X4 (GAUZE/BANDAGES/DRESSINGS) IMPLANT
SUCTION FRAZIER TIP 10 FR DISP (SUCTIONS) IMPLANT
SUT BONE WAX W31G (SUTURE) IMPLANT
SUT ETHIBOND 2 OS 4 DA (SUTURE) IMPLANT
SUT ETHILON 3 0 PS 1 (SUTURE) ×3 IMPLANT
SUT ETHILON 4 0 PS 2 18 (SUTURE) ×3 IMPLANT
SUT FIBERWIRE #2 38 T-5 BLUE (SUTURE)
SUT PROLENE 3 0 PS 2 (SUTURE) IMPLANT
SUT TICRON 1 T 12 (SUTURE) IMPLANT
SUT VIC AB 0 CT1 27 (SUTURE)
SUT VIC AB 0 CT1 27XBRD ANBCTR (SUTURE) IMPLANT
SUT VIC AB 1 CT1 27 (SUTURE)
SUT VIC AB 1 CT1 27XBRD ANBCTR (SUTURE) IMPLANT
SUT VIC AB 2-0 PS2 27 (SUTURE) IMPLANT
SUT VIC AB 2-0 SH 27 (SUTURE)
SUT VIC AB 2-0 SH 27XBRD (SUTURE) IMPLANT
SUTURE FIBERWR #2 38 T-5 BLUE (SUTURE) IMPLANT
TAPE SUT LABRALTAP WHT/BLK (SUTURE) ×6 IMPLANT
TOWEL OR 17X24 6PK STRL BLUE (TOWEL DISPOSABLE) ×3 IMPLANT
WAND STAR VAC 90 (SURGICAL WAND) IMPLANT
WATER STERILE IRR 1000ML POUR (IV SOLUTION) ×6 IMPLANT
YANKAUER SUCT BULB TIP NO VENT (SUCTIONS) IMPLANT

## 2015-08-18 NOTE — Discharge Instructions (Signed)
Diet: As you were doing prior to hospitalization   Activity: Increase activity slowly as tolerated  No lifting or driving for 6 weeks   Shower: May shower without a dressing on post op day #3, NO SOAKING in tub   Dressing: You may change your dressing on post op day #3.  Then change the dressing daily with sterile 4"x4"s gauze dressing  Or band aids.   Weight Bearing/Sling: nonweight bearing right arm.  To remain in sling with immobilizer strap at all times except bathing (keep arm close to body).  Proper positioning with sling and strap arm should be draped across belly.    To prevent constipation: you may use a stool softener such as -  Colace ( over the counter) 100 mg by mouth twice a day  Drink plenty of fluids ( prune juice may be helpful) and high fiber foods  Miralax ( over the counter) for constipation as needed.   Precautions: If you experience chest pain or shortness of breath - call 911 immediately For transfer to the hospital emergency department!!  If you develop a fever greater that 101 F, purulent drainage from wound, increased redness or drainage from wound, or calf pain -- Call the office   Follow- Up Appointment: Please call for an appointment to be seen in 1 week [place] - [phone]     Post Anesthesia Home Care Instructions  Activity: Get plenty of rest for the remainder of the day. A responsible adult should stay with you for 24 hours following the procedure.  For the next 24 hours, DO NOT: -Drive a car -Advertising copywriter -Drink alcoholic beverages -Take any medication unless instructed by your physician -Make any legal decisions or sign important papers.  Meals: Start with liquid foods such as gelatin or soup. Progress to regular foods as tolerated. Avoid greasy, spicy, heavy foods. If nausea and/or vomiting occur, drink only clear liquids until the nausea and/or vomiting subsides. Call your physician if vomiting continues.  Special  Instructions/Symptoms: Your throat may feel dry or sore from the anesthesia or the breathing tube placed in your throat during surgery. If this causes discomfort, gargle with warm salt water. The discomfort should disappear within 24 hours.  If you had a scopolamine patch placed behind your ear for the management of post- operative nausea and/or vomiting:  1. The medication in the patch is effective for 72 hours, after which it should be removed.  Wrap patch in a tissue and discard in the trash. Wash hands thoroughly with soap and water. 2. You may remove the patch earlier than 72 hours if you experience unpleasant side effects which may include dry mouth, dizziness or visual disturbances. 3. Avoid touching the patch. Wash your hands with soap and water after contact with the patch.

## 2015-08-18 NOTE — Anesthesia Preprocedure Evaluation (Signed)
Anesthesia Evaluation  Patient identified by MRN, date of birth, ID band Patient awake    Reviewed: Allergy & Precautions, H&P , NPO status , Patient's Chart, lab work & pertinent test results  History of Anesthesia Complications Negative for: history of anesthetic complications  Airway Mallampati: II  TM Distance: >3 FB Neck ROM: full    Dental no notable dental hx.    Pulmonary neg pulmonary ROS, Current Smoker,  breath sounds clear to auscultation  Pulmonary exam normal       Cardiovascular negative cardio ROS Normal cardiovascular examRhythm:regular Rate:Normal     Neuro/Psych  Headaches, PSYCHIATRIC DISORDERS Anxiety    GI/Hepatic negative GI ROS, Neg liver ROS,   Endo/Other  negative endocrine ROS  Renal/GU negative Renal ROS     Musculoskeletal   Abdominal   Peds  Hematology negative hematology ROS (+)   Anesthesia Other Findings   Reproductive/Obstetrics negative OB ROS                             Anesthesia Physical Anesthesia Plan  ASA: II  Anesthesia Plan: General and Regional   Post-op Pain Management: GA combined w/ Regional for post-op pain   Induction: Intravenous  Airway Management Planned: Oral ETT  Additional Equipment:   Intra-op Plan:   Post-operative Plan:   Informed Consent: I have reviewed the patients History and Physical, chart, labs and discussed the procedure including the risks, benefits and alternatives for the proposed anesthesia with the patient or authorized representative who has indicated his/her understanding and acceptance.     Plan Discussed with: Anesthesiologist, CRNA and Surgeon  Anesthesia Plan Comments:         Anesthesia Quick Evaluation

## 2015-08-18 NOTE — Anesthesia Procedure Notes (Addendum)
Procedure Name: Intubation Date/Time: 08/18/2015 1:21 PM Performed by: Genevieve Norlander L Pre-anesthesia Checklist: Patient identified, Emergency Drugs available, Suction available, Patient being monitored and Timeout performed Patient Re-evaluated:Patient Re-evaluated prior to inductionOxygen Delivery Method: Circle System Utilized Preoxygenation: Pre-oxygenation with 100% oxygen Intubation Type: IV induction Ventilation: Mask ventilation without difficulty Laryngoscope Size: Miller and 3 Grade View: Grade I Tube type: Oral Tube size: 7.0 mm Number of attempts: 1 Airway Equipment and Method: Stylet and Oral airway Placement Confirmation: ETT inserted through vocal cords under direct vision,  positive ETCO2 and breath sounds checked- equal and bilateral Secured at: 21 cm Tube secured with: Tape Dental Injury: Teeth and Oropharynx as per pre-operative assessment    Anesthesia Regional Block:  Interscalene brachial plexus block  Pre-Anesthetic Checklist: ,, timeout performed, Correct Patient, Correct Site, Correct Laterality, Correct Procedure, Correct Position, site marked, Risks and benefits discussed,  Surgical consent,  Pre-op evaluation,  At surgeon's request and post-op pain management  Laterality: Right  Prep: chloraprep       Needles:  Injection technique: Single-shot  Needle Type: Stimiplex     Needle Length: 5cm 5 cm Needle Gauge: 21 and 21 G    Additional Needles:  Procedures: ultrasound guided (picture in chart) Interscalene brachial plexus block Narrative:  Injection made incrementally with aspirations every 5 mL.  Performed by: Personally  Anesthesiologist: Zakar Brosch  Additional Notes: Risks, benefits and alternative to block explained extensively.  Patient tolerated procedure well, without complications.

## 2015-08-18 NOTE — Progress Notes (Signed)
Assisted Dr. B. Judd with right, ultrasound guided, interscalene  block. Side rails up, monitors on throughout procedure. See vital signs in flow sheet. Tolerated Procedure well. 

## 2015-08-18 NOTE — Anesthesia Postprocedure Evaluation (Signed)
  Anesthesia Post-op Note  Patient: Kristen Holmes  Procedure(s) Performed: Procedure(s) (LRB): RIGHT SHOULDER ARTHROSCOPY WITH DEBRIDEMENT AND CAPSULORRHAPHY (Right)  Patient Location: PACU  Anesthesia Type: General and regional  Level of Consciousness: awake and alert   Airway and Oxygen Therapy: Patient Spontanous Breathing  Post-op Pain: mild  Post-op Assessment: Post-op Vital signs reviewed, Patient's Cardiovascular Status Stable, Respiratory Function Stable, Patent Airway and No signs of Nausea or vomiting  Last Vitals:  Filed Vitals:   08/18/15 1445  BP: 106/66  Pulse: 105  Temp: 36.7 C  Resp: 22    Post-op Vital Signs: stable   Complications: No apparent anesthesia complications

## 2015-08-18 NOTE — Transfer of Care (Signed)
Immediate Anesthesia Transfer of Care Note  Patient: Kristen Holmes  Procedure(s) Performed: Procedure(s): RIGHT SHOULDER ARTHROSCOPY WITH DEBRIDEMENT AND CAPSULORRHAPHY (Right)  Patient Location: PACU  Anesthesia Type:GA combined with regional for post-op pain  Level of Consciousness: awake and patient cooperative  Airway & Oxygen Therapy: Patient Spontanous Breathing and Patient connected to face mask oxygen  Post-op Assessment: Report given to RN and Post -op Vital signs reviewed and stable  Post vital signs: Reviewed and stable  Last Vitals:  Filed Vitals:   08/18/15 1258  BP:   Pulse: 71  Temp:   Resp: 14    Complications: No apparent anesthesia complications

## 2015-08-18 NOTE — Brief Op Note (Signed)
08/18/2015  2:31 PM  PATIENT:  Kristen Holmes  19 y.o. female  PRE-OPERATIVE DIAGNOSIS:  RIGHT SHOULDER BANKART LESION   POST-OPERATIVE DIAGNOSIS:  RIGHT SHOULDER BANKART LESION   PROCEDURE:  Procedure(s): RIGHT SHOULDER ARTHROSCOPY WITH DEBRIDEMENT AND CAPSULORRHAPHY (Right)  SURGEON:  Surgeon(s) and Role:    * Frederico Hamman, MD - Primary  PHYSICIAN ASSISTANT: Margart Sickles, PA-C  ASSISTANTS:   ANESTHESIA:   regional and general  EBL:  Total I/O In: 1500 [I.V.:1500] Out: -   BLOOD ADMINISTERED:none  DRAINS: none   LOCAL MEDICATIONS USED:  NONE  SPECIMEN:  No Specimen  DISPOSITION OF SPECIMEN:  N/A  COUNTS:  YES  TOURNIQUET:  * No tourniquets in log *  DICTATION: .Other Dictation: Dictation Number unknown  PLAN OF CARE: Discharge to home after PACU  PATIENT DISPOSITION:  PACU - hemodynamically stable.   Delay start of Pharmacological VTE agent (>24hrs) due to surgical blood loss or risk of bleeding: not applicable

## 2015-08-19 ENCOUNTER — Encounter (HOSPITAL_BASED_OUTPATIENT_CLINIC_OR_DEPARTMENT_OTHER): Payer: Self-pay | Admitting: Orthopedic Surgery

## 2015-08-19 NOTE — Op Note (Signed)
[name], Kristen Holmes NO.:  [id number]  MEDICAL RECORD NO.:  [id number]  LOCATION:                               FACILITY:  [facility]  PHYSICIAN:  Dyke Brackett, M.D.    DATE OF BIRTH:  Dec 24, 1995  DATE OF PROCEDURE:  08/18/2015 DATE OF DISCHARGE:  08/18/2015                              OPERATIVE REPORT   INDICATIONS:  An 19 year old female with recurrent instability of the right shoulder, thought to be amenable to outpatient surgery.  PREOPERATIVE DIAGNOSIS:  Recurrent anterior-inferior instability, right shoulder.  POSTOPERATIVE DIAGNOSIS:  Recurrent anterior-inferior instability, right shoulder.  OPERATION: 1. Arthroscopic capsulorrhaphy. 2. Arthroscopic debridement of labrum, right shoulder.  SURGEON:  Dyke Brackett, M.D.  ASSISTANT:  Margart Sickles, PA-C.  ANESTHESIA:  General with nerve block.  DESCRIPTION OF PROCEDURE:  Examination under anesthesia showed she did sublux, although I could not get __________ dislocate anteriorly inferiorly.  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  We debrided this with arthroscope.  We then elevated the periosteal sleeve avulsion of the anterior-inferior glenohumeral ligament and repaired it using Arthrex bioabsorbable anchors.  We placed 2 sets of anchors, one with a mattress stitch and one with simple stitch with good fixation in the glenoid.  This reapproximated the anterior-inferior glenohumeral ligament nicely anteriorly and also reattached the normal attachment relative to the avulsion which had healed medially and inferiorly to its normal attachment.  Closure was effected with interrupted nylon.  Followup plan for the patient __________ 6 weeks, have suture removal in 7-10 days, and prescription for pain medicine.  Will follow up in 7-10 days.     Dyke Brackett, M.D.     WDC/MEDQ  D:   08/18/2015  T:  08/18/2015  Job:  [id number]

## 2015-08-24 ENCOUNTER — Ambulatory Visit: Payer: Medicaid Other | Admitting: Women's Health

## 2015-09-28 ENCOUNTER — Ambulatory Visit: Payer: Medicaid Other | Admitting: Women's Health

## 2015-10-04 ENCOUNTER — Ambulatory Visit: Payer: Medicaid Other | Admitting: Women's Health

## 2015-10-04 ENCOUNTER — Other Ambulatory Visit: Payer: Medicaid Other

## 2015-10-12 ENCOUNTER — Encounter (HOSPITAL_COMMUNITY): Payer: Self-pay

## 2015-10-12 ENCOUNTER — Other Ambulatory Visit: Payer: Medicaid Other

## 2015-10-12 ENCOUNTER — Encounter: Payer: Self-pay | Admitting: Obstetrics & Gynecology

## 2015-10-12 ENCOUNTER — Emergency Department (HOSPITAL_COMMUNITY)
Admission: EM | Admit: 2015-10-12 | Discharge: 2015-10-12 | Disposition: A | Discharge status: Home / Self Care | Payer: Medicaid Other | Attending: Emergency Medicine | Admitting: Emergency Medicine

## 2015-10-12 ENCOUNTER — Ambulatory Visit: Payer: Medicaid Other | Admitting: Women's Health

## 2015-10-12 DIAGNOSIS — F17210 Nicotine dependence, cigarettes, uncomplicated: Secondary | ICD-10-CM | POA: Diagnosis not present

## 2015-10-12 DIAGNOSIS — Z8669 Personal history of other diseases of the nervous system and sense organs: Secondary | ICD-10-CM | POA: Diagnosis not present

## 2015-10-12 DIAGNOSIS — Z8659 Personal history of other mental and behavioral disorders: Secondary | ICD-10-CM | POA: Insufficient documentation

## 2015-10-12 DIAGNOSIS — J029 Acute pharyngitis, unspecified: Secondary | ICD-10-CM | POA: Diagnosis present

## 2015-10-12 DIAGNOSIS — Z8619 Personal history of other infectious and parasitic diseases: Secondary | ICD-10-CM | POA: Diagnosis not present

## 2015-10-12 DIAGNOSIS — Z872 Personal history of diseases of the skin and subcutaneous tissue: Secondary | ICD-10-CM | POA: Diagnosis not present

## 2015-10-12 LAB — RAPID STREP SCREEN (MED CTR MEBANE ONLY): Streptococcus, Group A Screen (Direct): NEGATIVE

## 2015-10-12 LAB — MONONUCLEOSIS SCREEN: Mono Screen: NEGATIVE

## 2015-10-12 MED ORDER — PSEUDOEPHEDRINE HCL 60 MG PO TABS: 60.0000 mg | ORAL_TABLET | ORAL | Status: DC | PRN

## 2015-10-12 MED ORDER — MAGIC MOUTHWASH W/LIDOCAINE: 10.0000 mL | Freq: Three times a day (TID) | ORAL | Status: DC | PRN

## 2015-10-12 NOTE — ED Notes (Signed)
Pt c/o sore throat with burning , "difficulty swallowing" and fatigue x 3 days. Pt reports "it feels like I have a lump in my throat, like something is stuck". Denies fever.

## 2015-10-12 NOTE — ED Notes (Signed)
Patient with no complaints at this time. Respirations even and unlabored. Skin warm/dry. Discharge instructions reviewed with patient at this time. Patient given opportunity to voice concerns/ask questions. Patient discharged at this time and left Emergency Department with steady gait.   

## 2015-10-12 NOTE — Discharge Instructions (Signed)

## 2015-10-12 NOTE — ED Notes (Signed)
Pt reports sore throat for past few days and now feels like has a lump in her throat.

## 2015-10-13 NOTE — ED Provider Notes (Signed)
CSN: [id number]     Arrival date & time 10/12/15  0845 History   First MD Initiated Contact with Patient 10/12/15 [phone]     Chief Complaint  Patient presents with  . Sore Throat     (Consider location/radiation/quality/duration/timing/severity/associated sxs/prior Treatment) HPI   Lindie SpruceMiranda Gair is a 19 y.o. female who presents to the Emergency Department complaining of sore throat for one week and "burning" sensation to her throat for 3 days.  She states that it feels like a "lump in my throat" with swallowing.  She also complains of generalized malaise.  She reports occasional runny nose, but denies fever, abdominal pain, vomiting, rash, and other cold symptoms.  She has not tried any medications for her symptoms.     Past Medical History  Diagnosis Date  . ADHD (attention deficit hyperactivity disorder)     no current med.  . Tension headache   . Bankart lesion of right shoulder 07/2015  . History of chlamydia 07/21/2015  . Tattoo of skin     new tattoo left shoulder 08/12/2015   Past Surgical History  Procedure Laterality Date  . Wisdom tooth extraction    . Bankart repair Right 08/18/2015    Procedure: RIGHT SHOULDER ARTHROSCOPY WITH DEBRIDEMENT AND CAPSULORRHAPHY;  Surgeon: Frederico Hammananiel Caffrey, MD;  Location: [facility];  Service: Orthopedics;  Laterality: Right;  . Rotator cuff repair     Family History  Problem Relation Age of Onset  . Depression Mother   . Alcohol abuse Maternal Aunt   . Alcohol abuse Paternal Aunt   . Drug abuse Paternal Aunt   . Mental illness Paternal Uncle   . Diabetes Maternal Grandmother   . Mental illness Paternal Grandfather    Social History  Substance Use Topics  . Smoking status: Current Every Day Smoker -- 0.50 packs/day for 5 years    Types: Cigarettes  . Smokeless tobacco: Never Used  . Alcohol Use: Yes     Comment: occasionally   OB History    No data available     Review of Systems  Constitutional: Negative for  fever, chills, activity change and appetite change.  HENT: Positive for congestion and sore throat. Negative for ear pain, facial swelling, trouble swallowing and voice change.   Eyes: Negative for pain and visual disturbance.  Respiratory: Negative for cough and shortness of breath.   Gastrointestinal: Negative for nausea, vomiting and abdominal pain.  Musculoskeletal: Negative for arthralgias, neck pain and neck stiffness.  Skin: Negative for color change and rash.  Neurological: Negative for dizziness, facial asymmetry, speech difficulty, numbness and headaches.  Hematological: Negative for adenopathy.  All other systems reviewed and are negative.     Allergies  Aripiprazole and Trazodone and nefazodone  Home Medications   Prior to Admission medications   Medication Sig Start Date End Date Taking? Authorizing Provider  aspirin-acetaminophen-caffeine (EXCEDRIN MIGRAINE) [id number] MG tablet Take 2 tablets by mouth every 6 (six) hours as needed for headache.   Yes Historical Provider, MD  Dextromethorphan HBr (VICKS DAYQUIL COUGH) 15 MG/15ML LIQD Take 5 mLs by mouth 2 (two) times daily between meals as needed (sore throat).   Yes Historical Provider, MD  methocarbamol (ROBAXIN) 500 MG tablet Take 1 tablet (500 mg total) by mouth every 6 (six) hours as needed for muscle spasms. 08/18/15  Yes Joshua Chadwell, PA-C  oxyCODONE-acetaminophen (ROXICET) 5-325 MG per tablet Take 1-2 tablets by mouth every 4 (four) hours as needed for moderate pain or severe pain. 08/18/15  Yes Margart Sickles, PA-C  Phenyleph-Doxylamine-DM-APAP (NYQUIL SEVERE COLD/FLU) 5-6.25-10-325 MG/15ML LIQD Take 5 mLs by mouth daily as needed (Sore Throat).   Yes Historical Provider, MD  magic mouthwash w/lidocaine SOLN Take 10 mLs by mouth 3 (three) times daily as needed for mouth pain. Swish and spit , do not swallow 10/12/15   Joniah Bednarski, PA-C  pseudoephedrine (SUDAFED) 60 MG tablet Take 1 tablet (60 mg total) by mouth  every 4 (four) hours as needed for congestion. 10/12/15   Vuk Skillern, PA-C   BP 105/53 mmHg  Pulse 70  Temp(Src) 98.1 F (36.7 C) (Oral)  Resp 16  Ht  (1.575 m)  Wt 182 lb (82.555 kg)  BMI 33.28 kg/m2  SpO2 98%  LMP 09/21/2015 Physical Exam  Constitutional: She is oriented to person, place, and time. She appears well-developed and well-nourished. No distress.  HENT:  Head: Normocephalic and atraumatic.  Right Ear: Tympanic membrane and ear canal normal.  Left Ear: Tympanic membrane and ear canal normal.  Mouth/Throat: Uvula is midline and mucous membranes are normal. No trismus in the jaw. No uvula swelling. Posterior oropharyngeal erythema present. No oropharyngeal exudate, posterior oropharyngeal edema or tonsillar abscesses.  Neck: Normal range of motion. Neck supple.  Cardiovascular: Normal rate, regular rhythm and normal heart sounds.   Pulmonary/Chest: Effort normal and breath sounds normal.  Abdominal: There is no splenomegaly. There is no tenderness.  Musculoskeletal: Normal range of motion.  Lymphadenopathy:    She has no cervical adenopathy.  Neurological: She is alert and oriented to person, place, and time. She exhibits normal muscle tone. Coordination normal.  Skin: Skin is warm and dry.  Nursing note and vitals reviewed.   ED Course  Procedures (including critical care time) Labs Review Labs Reviewed  RAPID STREP SCREEN (NOT AT [facility])  CULTURE, GROUP A STREP  MONONUCLEOSIS SCREEN      MDM   Final diagnoses:  Pharyngitis    Pt well appearing, vitals stable, airway patent, uvula midline.  No concerning sx's for peritonsillar abscess.  Pt agrees to symptomatic tx and PMD f/u if needed.    Pauline Aus, PA-C 10/13/15 1746  Rolland Porter, MD 11/11/15 [phone]

## 2015-10-14 LAB — CULTURE, GROUP A STREP

## 2016-01-01 ENCOUNTER — Other Ambulatory Visit: Payer: Self-pay | Admitting: Advanced Practice Midwife

## 2016-01-01 MED ORDER — FLUCONAZOLE 150 MG PO TABS: ORAL_TABLET | ORAL | Status: DC

## 2016-01-01 NOTE — Progress Notes (Signed)
On abx for strep.  C/O yeast infection.  Diflucan sent to pharmacy

## 2016-02-28 ENCOUNTER — Encounter: Payer: Self-pay | Admitting: Women's Health

## 2016-02-28 ENCOUNTER — Ambulatory Visit (INDEPENDENT_AMBULATORY_CARE_PROVIDER_SITE_OTHER): Payer: Medicaid Other | Admitting: Women's Health

## 2016-02-28 VITALS — BP 106/68 | HR 100 | Ht 62.0 in | Wt 199.5 lb

## 2016-02-28 DIAGNOSIS — A499 Bacterial infection, unspecified: Secondary | ICD-10-CM | POA: Diagnosis not present

## 2016-02-28 DIAGNOSIS — Z3202 Encounter for pregnancy test, result negative: Secondary | ICD-10-CM | POA: Diagnosis not present

## 2016-02-28 DIAGNOSIS — Z30011 Encounter for initial prescription of contraceptive pills: Secondary | ICD-10-CM

## 2016-02-28 DIAGNOSIS — N760 Acute vaginitis: Secondary | ICD-10-CM | POA: Diagnosis not present

## 2016-02-28 DIAGNOSIS — Z113 Encounter for screening for infections with a predominantly sexual mode of transmission: Secondary | ICD-10-CM

## 2016-02-28 DIAGNOSIS — B9689 Other specified bacterial agents as the cause of diseases classified elsewhere: Secondary | ICD-10-CM

## 2016-02-28 DIAGNOSIS — E669 Obesity, unspecified: Secondary | ICD-10-CM

## 2016-02-28 LAB — POCT WET PREP (WET MOUNT): CLUE CELLS WET PREP WHIFF POC: POSITIVE

## 2016-02-28 LAB — POCT URINE PREGNANCY: Preg Test, Ur: NEGATIVE

## 2016-02-28 MED ORDER — NORETHIN-ETH ESTRAD-FE BIPHAS 1 MG-10 MCG / 10 MCG PO TABS: 1.0000 | ORAL_TABLET | Freq: Every day | ORAL | Status: DC

## 2016-02-28 MED ORDER — METRONIDAZOLE 0.75 % VA GEL: 1.0000 | Freq: Every day | VAGINAL | Status: DC

## 2016-02-28 NOTE — Patient Instructions (Signed)
Stop smoking Use condoms always for STD prevention Whole 30 for weight loss  Oral Contraception Information Oral contraceptive pills (OCPs) are medicines taken to prevent pregnancy. OCPs work by preventing the ovaries from releasing eggs. The hormones in OCPs also cause the cervical mucus to thicken, preventing the sperm from entering the uterus. The hormones also cause the uterine lining to become thin, not allowing a fertilized egg to attach to the inside of the uterus. OCPs are highly effective when taken exactly as prescribed. However, OCPs do not prevent sexually transmitted diseases (STDs). Safe sex practices, such as using condoms along with the pill, can help prevent STDs.  Before taking the pill, you may have a physical exam and Pap test. Your health care provider may order blood tests. The health care provider will make sure you are a good candidate for oral contraception. Discuss with your health care provider the possible side effects of the OCP you may be prescribed. When starting an OCP, it can take 2 to 3 months for the body to adjust to the changes in hormone levels in your body.  TYPES OF ORAL CONTRACEPTION  The combination pill--This pill contains estrogen and progestin (synthetic progesterone) hormones. The combination pill comes in 21-day, 28-day, or 91-day packs. Some types of combination pills are meant to be taken continuously (365-day pills). With 21-day packs, you do not take pills for 7 days after the last pill. With 28-day packs, the pill is taken every day. The last 7 pills are without hormones. Certain types of pills have more than 21 hormone-containing pills. With 91-day packs, the first 84 pills contain both hormones, and the last 7 pills contain no hormones or contain estrogen only.  The minipill--This pill contains the progesterone hormone only. The pill is taken every day continuously. It is very important to take the pill at the same time each day. The minipill comes in  packs of 28 pills. All 28 pills contain the hormone.  ADVANTAGES OF ORAL CONTRACEPTIVE PILLS  Decreases premenstrual symptoms.   Treats menstrual period cramps.   Regulates the menstrual cycle.   Decreases a heavy menstrual flow.   May treatacne, depending on the type of pill.   Treats abnormal uterine bleeding.   Treats polycystic ovarian syndrome.   Treats endometriosis.   Can be used as emergency contraception.  THINGS THAT CAN MAKE ORAL CONTRACEPTIVE PILLS LESS EFFECTIVE OCPs can be less effective if:   You forget to take the pill at the same time every day.   You have a stomach or intestinal disease that lessens the absorption of the pill.   You take OCPs with other medicines that make OCPs less effective, such as antibiotics, certain HIV medicines, and some seizure medicines.   You take expired OCPs.   You forget to restart the pill on day 7, when using the packs of 21 pills.  RISKS ASSOCIATED WITH ORAL CONTRACEPTIVE PILLS  Oral contraceptive pills can sometimes cause side effects, such as:  Headache.  Nausea.  Breast tenderness.  Irregular bleeding or spotting. Combination pills are also associated with a small increased risk of:  Blood clots.  Heart attack.  Stroke.   This information is not intended to replace advice given to you by your health care provider. Make sure you discuss any questions you have with your health care provider.   Document Released: 02/24/2003 Document Revised: 09/24/2013 Document Reviewed: 05/25/2013 Elsevier Interactive Patient Education 2016 Elsevier Inc.  Ethinyl Estradiol; Norethindrone Acetate tablets (contraception) What is  this medicine? ETHINYL ESTRADIOL; NORETHINDRONE ACETATE (ETH in il es tra DYE ole; nor eth IN drone AS e tate) is an oral contraceptive. The products combine two types of female hormones, an estrogen and a progestin. They are used to prevent ovulation and pregnancy. This medicine may  be used for other purposes; ask your health care provider or pharmacist if you have questions. What should I tell my health care provider before I take this medicine? They need to know if you have or ever had any of these conditions: -abnormal vaginal bleeding -blood vessel disease or blood clots -breast, cervical, endometrial, ovarian, liver, or uterine cancer -diabetes -gallbladder disease -heart disease or recent heart attack -high blood pressure -high cholesterol -kidney disease -liver disease -migraine headaches -stroke -systemic lupus erythematosus (SLE) -tobacco smoker -an unusual or allergic reaction to estrogens, progestins, other medicines, foods, dyes, or preservatives -pregnant or trying to get pregnant -breast-feeding How should I use this medicine? Take this medicine by mouth. To reduce nausea, this medicine may be taken with food. Follow the directions on the prescription label. Take this medicine at the same time each day and in the order directed on the package. Do not take your medicine more often than directed. Contact your pediatrician regarding the use of this medicine in children. Special care may be needed. This medicine has been used in female children who have started having menstrual periods. A patient package insert for the product will be given with each prescription and refill. Read this sheet carefully each time. The sheet may change frequently. Overdosage: If you think you have taken too much of this medicine contact a poison control center or emergency room at once. NOTE: This medicine is only for you. Do not share this medicine with others. What if I miss a dose? If you miss a dose, refer to the patient information sheet you received with your medicine for direction. If you miss more than one pill, this medicine may not be as effective and you may need to use another form of birth control. What may interact with this medicine? -acetaminophen -antibiotics  or medicines for infections, especially rifampin, rifabutin, rifapentine, and griseofulvin, and possibly penicillins or tetracyclines -aprepitant -ascorbic acid (vitamin C) -atorvastatin -barbiturate medicines, such as phenobarbital -bosentan -carbamazepine -caffeine -clofibrate -cyclosporine -dantrolene -doxercalciferol -felbamate -grapefruit juice -hydrocortisone -medicines for anxiety or sleeping problems, such as diazepam or temazepam -medicines for diabetes, including pioglitazone -mineral oil -modafinil -mycophenolate -nefazodone -oxcarbazepine -phenytoin -prednisolone -ritonavir or other medicines for HIV infection or AIDS -rosuvastatin -selegiline -soy isoflavones supplements -St. John's wort -tamoxifen or raloxifene -theophylline -thyroid hormones -topiramate -warfarin This list may not describe all possible interactions. Give your health care provider a list of all the medicines, herbs, non-prescription drugs, or dietary supplements you use. Also tell them if you smoke, drink alcohol, or use illegal drugs. Some items may interact with your medicine. What should I watch for while using this medicine? Visit your doctor or health care professional for regular checks on your progress. You will need a regular breast and pelvic exam and Pap smear while on this medicine. Use an additional method of contraception during the first cycle that you take these tablets. If you have any reason to think you are pregnant, stop taking this medicine right away and contact your doctor or health care professional. If you are taking this medicine for hormone related problems, it may take several cycles of use to see improvement in your condition. Smoking increases the risk of getting a  blood clot or having a stroke while you are taking birth control pills, especially if you are more than 20 years old. You are strongly advised not to smoke. This medicine can make your body retain fluid,  making your fingers, hands, or ankles swell. Your blood pressure can go up. Contact your doctor or health care professional if you feel you are retaining fluid. This medicine can make you more sensitive to the sun. Keep out of the sun. If you cannot avoid being in the sun, wear protective clothing and use sunscreen. Do not use sun lamps or tanning beds/booths. If you wear contact lenses and notice visual changes, or if the lenses begin to feel uncomfortable, consult your eye care specialist. In some women, tenderness, swelling, or minor bleeding of the gums may occur. Notify your dentist if this happens. Brushing and flossing your teeth regularly may help limit this. See your dentist regularly and inform your dentist of the medicines you are taking. If you are going to have elective surgery, you may need to stop taking this medicine before the surgery. Consult your health care professional for advice. This medicine does not protect you against HIV infection (AIDS) or any other sexually transmitted diseases. What side effects may I notice from receiving this medicine? Side effects that you should report to your doctor or health care professional as soon as possible: -breast tissue changes or discharge -changes in vaginal bleeding during your period or between your periods -chest pain -coughing up blood -dizziness or fainting spells -headaches or migraines -leg, arm or groin pain -severe or sudden headaches -stomach pain (severe) -sudden shortness of breath -sudden loss of coordination, especially on one side of the body -speech problems -symptoms of vaginal infection like itching, irritation or unusual discharge -tenderness in the upper abdomen -vomiting -weakness or numbness in the arms or legs, especially on one side of the body -yellowing of the eyes or skin Side effects that usually do not require medical attention (report to your doctor or health care professional if they continue or are  bothersome): -breakthrough bleeding and spotting that continues beyond the 3 initial cycles of pills -breast tenderness -mood changes, anxiety, depression, frustration, anger, or emotional outbursts -increased sensitivity to sun or ultraviolet light -nausea -skin rash, acne, or brown spots on the skin -weight gain (slight) This list may not describe all possible side effects. Call your doctor for medical advice about side effects. You may report side effects to FDA at 1-800-FDA-1088. Where should I keep my medicine? Keep out of the reach of children. Store at room temperature between 15 and 30 degrees C (59 and 86 degrees F). Throw away any unused medicine after the expiration date. NOTE: This sheet is a summary. It may not cover all possible information. If you have questions about this medicine, talk to your doctor, pharmacist, or health care provider.    2016, Elsevier/Gold Standard. (2013-04-11 15:35:20)

## 2016-02-28 NOTE — Progress Notes (Signed)
Patient ID: Kristen Holmes, female   DOB: 02/04/1996, 20 y.o.   MRN: [id number]   [facility]  Patient name: Kristen Holmes MRN [id number]  Date of birth: 02/04/1996  CC & HPI:  Kristen Holmes is a 20 y.o.  Caucasian female presenting today requesting STD screening- only wants gc/ct and trich. Vaginal d/c has increased but hasn't noticed odor/itching/irritation. Does have new sexual partner. Wants coc's. No h/o HTN, DVT/PE, CVA, MI, or migraines w/ aura. Does smoke 2cigarettes/day and trying to quit. Wants help losing weight. States she gained 50lbs w/ nexplanon.  Patient's last menstrual period was 02/10/2016.  Pertinent History Reviewed:  Medical & Surgical Hx:   Past medical, surgical, family, and social history reviewed in electronic medical record Medications: Reviewed & Updated - see associated section Allergies: Reviewed in electronic medical record  Objective Findings:  Vitals: BP 106/68 mmHg  Pulse 100  Ht 5\' 2"  (1.575 m)  Wt 199 lb 8 oz (90.493 kg)  BMI 36.48 kg/m2  LMP 02/10/2016 Body mass index is 36.48 kg/(m^2).  Physical Examination: General appearance - alert, well appearing, and in no distress Pelvic - cx appears normal, thin white malodorous d/c, wet prep c/w BV, no trich noted  Results for orders placed or performed in visit on 02/28/16 (from the past 24 hour(s))  POCT urine pregnancy   Collection Time: 02/28/16  4:28 PM  Result Value Ref Range   Preg Test, Ur Negative Negative  POCT Wet Prep Mellody Drown(Wet [place])   Collection Time: 02/28/16  4:47 PM  Result Value Ref Range   Source Wet Prep POC vaginal    WBC, Wet Prep HPF POC none    Bacteria Wet Prep HPF POC None None, Few, Too numerous to count   BACTERIA WET PREP MORPHOLOGY POC     Clue Cells Wet Prep HPF POC Many (A) None, Too numerous to count   Clue Cells Wet Prep Whiff POC Positive Whiff    Yeast Wet Prep HPF POC None    KOH Wet Prep POC     Trichomonas Wet Prep HPF POC none      Assessment  & Plan:  A:   STD screen  BV  Contraception management  Obese  P:  GC/CT from urine, declines any other STD screening  Rx metrogel q hs x 5nights for BV, no sex or etoh. Is allergic to aripiprazole (abilify) caused restlessness, has taken metronidazole before w/o any problems  Rx LoLoestrin 3pk w/ 3RF  Discussed trying Whole30, increasing exercise  Return in about 3 months (around 05/30/2016) for F/U.  Marge DuncansBooker, Josejuan Hoaglin Randall CNM, [facility]NP-BC 02/28/2016 4:48 PM

## 2016-03-01 ENCOUNTER — Telehealth: Payer: Self-pay | Admitting: Women's Health

## 2016-03-01 LAB — GC/CHLAMYDIA PROBE AMP
Chlamydia trachomatis, NAA: POSITIVE — AB
Neisseria gonorrhoeae by PCR: NEGATIVE

## 2016-03-01 MED ORDER — AZITHROMYCIN 500 MG PO TABS: 1000.0000 mg | ORAL_TABLET | Freq: Once | ORAL | Status: DC

## 2016-03-01 NOTE — Telephone Encounter (Signed)
LM for pt to return call. Need to notify of +CT, rx sent to pharmacy, partner's info (name, dob, allergies, pharmacy), no sex until POC.  Cheral MarkerKimberly R. Augusto Deckman, CNM, [facility]NP-BC 03/01/2016 9:16 AM

## 2016-03-08 ENCOUNTER — Encounter (HOSPITAL_COMMUNITY): Payer: Self-pay | Admitting: Emergency Medicine

## 2016-03-08 ENCOUNTER — Emergency Department (HOSPITAL_COMMUNITY)
Admission: EM | Admit: 2016-03-08 | Discharge: 2016-03-08 | Disposition: A | Discharge status: Home / Self Care | Payer: Medicaid Other | Attending: Dermatology | Admitting: Dermatology

## 2016-03-08 DIAGNOSIS — W010XXA Fall on same level from slipping, tripping and stumbling without subsequent striking against object, initial encounter: Secondary | ICD-10-CM | POA: Diagnosis not present

## 2016-03-08 DIAGNOSIS — S8992XA Unspecified injury of left lower leg, initial encounter: Secondary | ICD-10-CM | POA: Insufficient documentation

## 2016-03-08 DIAGNOSIS — Y999 Unspecified external cause status: Secondary | ICD-10-CM | POA: Insufficient documentation

## 2016-03-08 DIAGNOSIS — S199XXA Unspecified injury of neck, initial encounter: Secondary | ICD-10-CM | POA: Diagnosis not present

## 2016-03-08 DIAGNOSIS — Z5321 Procedure and treatment not carried out due to patient leaving prior to being seen by health care provider: Secondary | ICD-10-CM | POA: Diagnosis not present

## 2016-03-08 DIAGNOSIS — Y939 Activity, unspecified: Secondary | ICD-10-CM | POA: Diagnosis not present

## 2016-03-08 DIAGNOSIS — Y929 Unspecified place or not applicable: Secondary | ICD-10-CM | POA: Insufficient documentation

## 2016-03-08 DIAGNOSIS — S4992XA Unspecified injury of left shoulder and upper arm, initial encounter: Secondary | ICD-10-CM | POA: Insufficient documentation

## 2016-03-08 DIAGNOSIS — F17210 Nicotine dependence, cigarettes, uncomplicated: Secondary | ICD-10-CM | POA: Insufficient documentation

## 2016-03-08 NOTE — ED Notes (Signed)
Tripped and fell on cement, wearing flip-flops.  Injury to left shoulder, left knee and neck.

## 2016-03-08 NOTE — ED Notes (Signed)
Pt seen getting into a car with blood pressure cuff still on arm.  Assuming pt left.

## 2016-03-20 ENCOUNTER — Telehealth: Payer: Self-pay | Admitting: Women's Health

## 2016-03-20 NOTE — Telephone Encounter (Signed)
Returned pt's call. She never called us back w/ partner's info for CT+ tx b/c she didn't plan on having sex w/ him again, however she did have sex w/ him yesterday and he has not been treated. Pt did take her rx as directed, has appt for poc 03/22/16- we will push it out 1 week until 03/29/16 in case she was reinfected yesterday. No more sex. Will call her w/ results.  Cheral MarkerKimberly R. Booker, CNM, WHNP-BC 03/20/2016 1:32 PM

## 2016-03-22 ENCOUNTER — Other Ambulatory Visit: Payer: Medicaid Other

## 2016-03-29 ENCOUNTER — Ambulatory Visit: Payer: Medicaid Other | Admitting: Women's Health

## 2016-04-04 ENCOUNTER — Ambulatory Visit: Payer: Medicaid Other | Admitting: Women's Health

## 2016-04-04 ENCOUNTER — Encounter: Payer: Self-pay | Admitting: Women's Health

## 2016-04-05 ENCOUNTER — Other Ambulatory Visit: Payer: Medicaid Other

## 2016-04-05 DIAGNOSIS — Z202 Contact with and (suspected) exposure to infections with a predominantly sexual mode of transmission: Secondary | ICD-10-CM

## 2016-04-05 NOTE — Progress Notes (Signed)
Pt here for POC.  Was told again no sex until results are back and if needs partner treated to call us with his information.  Pt verbalized understanding.

## 2016-04-06 LAB — GC/CHLAMYDIA PROBE AMP
CHLAMYDIA, DNA PROBE: NEGATIVE
Neisseria gonorrhoeae by PCR: NEGATIVE

## 2016-04-13 ENCOUNTER — Telehealth: Payer: Self-pay | Admitting: Women's Health

## 2016-04-13 NOTE — Telephone Encounter (Signed)
Pt called stating that she would like to know the results of her blood work that she had last week. Please contact pt

## 2016-04-13 NOTE — Telephone Encounter (Signed)
Pt informed GC/CHL from 04/05/2016 negative.

## 2016-05-30 ENCOUNTER — Ambulatory Visit: Payer: Medicaid Other | Admitting: Women's Health

## 2016-07-11 ENCOUNTER — Ambulatory Visit: Payer: Medicaid Other | Admitting: Obstetrics and Gynecology

## 2016-07-24 ENCOUNTER — Encounter (HOSPITAL_COMMUNITY): Payer: Self-pay | Admitting: Emergency Medicine

## 2016-07-24 ENCOUNTER — Emergency Department (HOSPITAL_COMMUNITY)
Admission: EM | Admit: 2016-07-24 | Discharge: 2016-07-24 | Disposition: A | Discharge status: Home / Self Care | Payer: Medicaid Other | Attending: Emergency Medicine | Admitting: Emergency Medicine

## 2016-07-24 DIAGNOSIS — F17210 Nicotine dependence, cigarettes, uncomplicated: Secondary | ICD-10-CM | POA: Diagnosis not present

## 2016-07-24 DIAGNOSIS — Z7982 Long term (current) use of aspirin: Secondary | ICD-10-CM | POA: Insufficient documentation

## 2016-07-24 DIAGNOSIS — R05 Cough: Secondary | ICD-10-CM | POA: Diagnosis not present

## 2016-07-24 DIAGNOSIS — J029 Acute pharyngitis, unspecified: Secondary | ICD-10-CM | POA: Diagnosis present

## 2016-07-24 DIAGNOSIS — J069 Acute upper respiratory infection, unspecified: Secondary | ICD-10-CM

## 2016-07-24 DIAGNOSIS — B9789 Other viral agents as the cause of diseases classified elsewhere: Secondary | ICD-10-CM

## 2016-07-24 DIAGNOSIS — F909 Attention-deficit hyperactivity disorder, unspecified type: Secondary | ICD-10-CM | POA: Diagnosis not present

## 2016-07-24 LAB — RAPID STREP SCREEN (MED CTR MEBANE ONLY): STREPTOCOCCUS, GROUP A SCREEN (DIRECT): NEGATIVE

## 2016-07-24 MED ORDER — HYDROCOD POLST-CPM POLST ER 10-8 MG/5ML PO SUER: 5.0000 mL | Freq: Two times a day (BID) | ORAL | 0 refills | Status: DC

## 2016-07-24 NOTE — ED Provider Notes (Signed)
[facility]-EMERGENCY DEPT Provider Note   CSN: [id number] Arrival date & time: 07/24/16  2018  First Provider Contact:   First MD Initiated Contact with Patient 07/24/16 2113       By signing my name below, I, Octavia Heirrianna Nassar, attest that this documentation has been prepared under the direction and in the presence of Felicie Mornavid Primo Innis, NP.  Electronically Signed: Octavia HeirArianna Nassar, ED Scribe. 07/24/16. 9:18 PM.     History   Chief Complaint Chief Complaint  Patient presents with  . Sore Throat    The history is provided by the patient. No language interpreter was used.  Sore Throat  This is a recurrent problem. The current episode started more than 1 week ago. The problem occurs daily. The problem has not changed since onset.Pertinent negatives include no abdominal pain.   HPI Comments: Kristen Holmes is a 20 y.o. female who presents to the Emergency Department complaining of intermittent, gradual worsening, moderate sore throat onset one month ago. Pt reports associated intermittent rhinorrhea, cough, and nasal congestion. Pt says her cough is worse at night. Pt has taken DayQuil and NyQuil to alleviate her symptoms with no relief.There are no aggravating factors. Pt denies fever, chills, nausea, vomiting, diarrhea, or abdominal pain.  Past Medical History:  Diagnosis Date  . ADHD (attention deficit hyperactivity disorder)    no current med.  . Bankart lesion of right shoulder 07/2015  . History of chlamydia 07/21/2015  . Tattoo of skin    new tattoo left shoulder 08/12/2015  . Tension headache     Patient Active Problem List   Diagnosis Date Noted  . Nexplanon removal 07/20/2015  . Contraceptive management 07/20/2015  . Chlamydia infection 12/03/2014  . GC (gonococcus infection) 12/03/2014  . Mood swings (HCC) 10/25/2011  . ADHD (attention deficit hyperactivity disorder), predominantly hyperactive impulsive type 10/25/2011  . Alcohol abuse 10/25/2011    Past Surgical History:  Procedure  Laterality Date  . BANKART REPAIR Right 08/18/2015   Procedure: RIGHT SHOULDER ARTHROSCOPY WITH DEBRIDEMENT AND CAPSULORRHAPHY;  Surgeon: Frederico Hammananiel Caffrey, MD;  Location: [facility];  Service: Orthopedics;  Laterality: Right;  . ROTATOR CUFF REPAIR    . SHOULDER ARTHROSCOPY W/ ROTATOR CUFF REPAIR    . WISDOM TOOTH EXTRACTION      OB History    No data available       Home Medications    Prior to Admission medications   Medication Sig Start Date End Date Taking? Authorizing Provider  aspirin-acetaminophen-caffeine (EXCEDRIN MIGRAINE) [phone] MG tablet Take 2 tablets by mouth every 6 (six) hours as needed for headache. Reported on 02/28/2016    Historical Provider, MD  azithromycin (ZITHROMAX) 500 MG tablet Take 2 tablets (1,000 mg total) by mouth once. 03/01/16   Cheral MarkerKimberly R Booker, CNM  Dextromethorphan HBr (VICKS DAYQUIL COUGH) 15 MG/15ML LIQD Take 5 mLs by mouth 2 (two) times daily between meals as needed (sore throat). Reported on 02/28/2016    Historical Provider, MD  fluconazole (DIFLUCAN) 150 MG tablet 1 po stat; repeat in 3 days Patient not taking: Reported on 02/28/2016 01/01/16   Jacklyn ShellFrances Cresenzo-Dishmon, CNM  magic mouthwash w/lidocaine SOLN Take 10 mLs by mouth 3 (three) times daily as needed for mouth pain. Swish and spit , do not swallow Patient not taking: Reported on 02/28/2016 10/12/15   Tammy Triplett, PA-C  methocarbamol (ROBAXIN) 500 MG tablet Take 1 tablet (500 mg total) by mouth every 6 (six) hours as needed for muscle spasms. Patient not taking: Reported on  02/28/2016 08/18/15   Margart Sickles, PA-C  metroNIDAZOLE (METROGEL VAGINAL) 0.75 % vaginal gel Place 1 Applicatorful vaginally at bedtime. X 5 nights, no alcohol or sex while using 02/28/16   Cheral Marker, CNM  Norethindrone-Ethinyl Estradiol-Fe Biphas (LO LOESTRIN FE) 1 MG-10 MCG / 10 MCG tablet Take 1 tablet by mouth daily. 02/28/16   Cheral Marker, CNM  omeprazole (PRILOSEC) 20 MG capsule Take  20 mg by mouth daily.    Historical Provider, MD  oxyCODONE-acetaminophen (ROXICET) 5-325 MG per tablet Take 1-2 tablets by mouth every 4 (four) hours as needed for moderate pain or severe pain. Patient not taking: Reported on 02/28/2016 08/18/15   Margart Sickles, PA-C  Phenyleph-Doxylamine-DM-APAP (NYQUIL SEVERE COLD/FLU) 5-6.25-10-325 MG/15ML LIQD Take 5 mLs by mouth daily as needed (Sore Throat). Reported on 02/28/2016    Historical Provider, MD  pseudoephedrine (SUDAFED) 60 MG tablet Take 1 tablet (60 mg total) by mouth every 4 (four) hours as needed for congestion. Patient not taking: Reported on 02/28/2016 10/12/15   Pauline Aus, PA-C    Family History Family History  Problem Relation Age of Onset  . Depression Mother   . Alcohol abuse Maternal Aunt   . Alcohol abuse Paternal Aunt   . Drug abuse Paternal Aunt   . Mental illness Paternal Uncle   . Diabetes Maternal Grandmother   . Mental illness Paternal Grandfather     Social History Social History  Substance Use Topics  . Smoking status: Current Every Day Smoker    Types: Cigarettes  . Smokeless tobacco: Never Used  . Alcohol use Yes     Allergies   Aripiprazole and Trazodone and nefazodone   Review of Systems Review of Systems  Constitutional: Negative for chills and fever.  HENT: Positive for congestion, rhinorrhea and sore throat.   Respiratory: Positive for cough.   Gastrointestinal: Negative for abdominal pain, diarrhea, nausea and vomiting.     Physical Exam Updated Vital Signs BP 121/69 (BP Location: Left Arm)   Pulse 76   Temp 98.4 F (36.9 C) (Oral)   Resp 16   Ht 5\' 2"  (1.575 m)   Wt 190 lb (86.2 kg)   LMP 07/23/2016   SpO2 98%   BMI 34.75 kg/m   Physical Exam  Constitutional: She is oriented to person, place, and time. She appears well-developed and well-nourished.  HENT:  Head: Normocephalic.  Mouth/Throat: No oropharyngeal exudate.  Posterior oropharynx erythema, no exudate  Eyes: EOM  are normal.  Neck: Normal range of motion.  Pulmonary/Chest: Effort normal. She has no wheezes.  Lungs clear  Abdominal: She exhibits no distension.  Musculoskeletal: Normal range of motion.  Neurological: She is alert and oriented to person, place, and time.  Psychiatric: She has a normal mood and affect.  Nursing note and vitals reviewed.    ED Treatments / Results  DIAGNOSTIC STUDIES: Oxygen Saturation is 98% on RA, normal by my interpretation.  COORDINATION OF CARE:  9:15 PM Discussed treatment plan with pt at bedside and pt agreed to plan. Pt was advised to follow up with PCP.  Labs (all labs ordered are listed, but only abnormal results are displayed) Labs Reviewed  RAPID STREP SCREEN (NOT AT [facility])    EKG  EKG Interpretation None       Radiology No results found.  Procedures Procedures (including critical care time)  Medications Ordered in ED Medications - No data to display   Initial Impression / Assessment and Plan / ED Course  I  have reviewed the triage vital signs and the nursing notes.  Pertinent labs & imaging results that were available during my care of the patient were reviewed by me and considered in my medical decision making (see chart for details).  Clinical Course   Pt with negative strep. Diagnosis of viral pharyngitis. No abx indicated at this time. Discussed that results of strep culture are pending and patient will be informed if positive result and abx will be called in at that time. Discharge with symptomatic tx. No evidence of dehydration. Pt is tolerating secretions. Presentation not concerning for peritonsillar abscess or spread of infection to deep spaces of the throat; patent airway. Specific return precautions discussed. Recommended PCP follow up. Pt appears safe for discharge.   Final Clinical Impressions(s) / ED Diagnoses   Final diagnoses:  None  Viral URI with cough. Pharyngitis.   I personally performed the services  described in this documentation, which was scribed in my presence. The recorded information has been reviewed and is accurate.  New Prescriptions New Prescriptions   No medications on file     Felicie Morn, NP 07/25/16 1610    Pricilla Loveless, MD 07/27/16 0130

## 2016-07-24 NOTE — ED Triage Notes (Signed)
Pt. reports sore throat for 1 month with occasional productive cough , denies fever / respirations unlabored .

## 2016-07-27 LAB — CULTURE, GROUP A STREP (THRC)

## 2016-12-19 ENCOUNTER — Other Ambulatory Visit: Payer: Self-pay | Admitting: Advanced Practice Midwife

## 2016-12-19 MED ORDER — AZITHROMYCIN 200 MG/5ML PO SUSR: 1000.0000 mg | Freq: Once | ORAL | 0 refills | Status: AC

## 2016-12-19 NOTE — Progress Notes (Signed)
CHL contact.  Azithromycin 1gm PO

## 2017-01-08 ENCOUNTER — Encounter (HOSPITAL_COMMUNITY): Payer: Self-pay

## 2017-01-08 ENCOUNTER — Emergency Department (HOSPITAL_COMMUNITY)
Admission: EM | Admit: 2017-01-08 | Discharge: 2017-01-08 | Disposition: A | Discharge status: Home / Self Care | Payer: Medicaid Other | Attending: Emergency Medicine | Admitting: Emergency Medicine

## 2017-01-08 DIAGNOSIS — M26622 Arthralgia of left temporomandibular joint: Secondary | ICD-10-CM | POA: Insufficient documentation

## 2017-01-08 DIAGNOSIS — F909 Attention-deficit hyperactivity disorder, unspecified type: Secondary | ICD-10-CM | POA: Insufficient documentation

## 2017-01-08 DIAGNOSIS — Z7982 Long term (current) use of aspirin: Secondary | ICD-10-CM | POA: Insufficient documentation

## 2017-01-08 DIAGNOSIS — F17210 Nicotine dependence, cigarettes, uncomplicated: Secondary | ICD-10-CM | POA: Insufficient documentation

## 2017-01-08 DIAGNOSIS — Z79899 Other long term (current) drug therapy: Secondary | ICD-10-CM | POA: Insufficient documentation

## 2017-01-08 MED ORDER — IBUPROFEN 600 MG PO TABS: 600.0000 mg | ORAL_TABLET | Freq: Four times a day (QID) | ORAL | 0 refills | Status: DC | PRN

## 2017-01-08 NOTE — ED Triage Notes (Signed)
Pt reports her jaw intermittently pops when she opens her mouth. Pt denies that her jaw gets stuck open.

## 2017-01-08 NOTE — Discharge Instructions (Signed)
Ibuprofen as needed for pain.  See attached handout for additional evaluation of your diagnosis.  Follow up with dentistry or your primary care provider if symptoms do not improve.  Return to ER for new or worsening symptoms, any additional concerns.

## 2017-01-08 NOTE — ED Provider Notes (Signed)
[facility] DEPT Provider Note   CSN: [id number] Arrival date & time: 01/08/17  1216  By signing my name below, I, Sonum Patel, attest that this documentation has been prepared under the direction and in the presence of [facility], PA-C. Electronically Signed: Sonum Patel, Neurosurgeon. 01/08/17. 2:26 PM.  History   Chief Complaint Chief Complaint  Patient presents with  . Jaw Pain    The history is provided by the patient. No language interpreter was used.     HPI Comments: Bhavika Schnider is a 21 y.o. female who presents to the Emergency Department complaining of 1 week of intermittent left sided jaw pain that worsened today. She denies known injury or trauma to the affected area. She denies recently eating chewy foods. She does not think her jaw was dislocated but she did hear a "crack" sound once when she opened her mouth. She has taken ibuprofen and applied ice without significant relief. She denies any other symptoms at this time.   Past Medical History:  Diagnosis Date  . ADHD (attention deficit hyperactivity disorder)    no current med.  . Bankart lesion of right shoulder 07/2015  . History of chlamydia 07/21/2015  . Tattoo of skin    new tattoo left shoulder 08/12/2015  . Tension headache     Patient Active Problem List   Diagnosis Date Noted  . Nexplanon removal 07/20/2015  . Contraceptive management 07/20/2015  . Chlamydia infection 12/03/2014  . GC (gonococcus infection) 12/03/2014  . Mood swings (HCC) 10/25/2011  . ADHD (attention deficit hyperactivity disorder), predominantly hyperactive impulsive type 10/25/2011  . Alcohol abuse 10/25/2011    Past Surgical History:  Procedure Laterality Date  . BANKART REPAIR Right 08/18/2015   Procedure: RIGHT SHOULDER ARTHROSCOPY WITH DEBRIDEMENT AND CAPSULORRHAPHY;  Surgeon: Frederico Hamman, MD;  Location: [facility];  Service: Orthopedics;  Laterality: Right;  . ROTATOR CUFF REPAIR    . SHOULDER ARTHROSCOPY W/  ROTATOR CUFF REPAIR    . WISDOM TOOTH EXTRACTION      OB History    No data available       Home Medications    Prior to Admission medications   Medication Sig Start Date End Date Taking? Authorizing Provider  aspirin-acetaminophen-caffeine (EXCEDRIN MIGRAINE) [phone] MG tablet Take 2 tablets by mouth every 6 (six) hours as needed for headache. Reported on 02/28/2016    Historical Provider, MD  chlorpheniramine-HYDROcodone (TUSSIONEX PENNKINETIC ER) 10-8 MG/5ML SUER Take 5 mLs by mouth 2 (two) times daily. 07/24/16   Felicie Morn, NP  Dextromethorphan HBr (VICKS DAYQUIL COUGH) 15 MG/15ML LIQD Take 5 mLs by mouth 2 (two) times daily between meals as needed (sore throat). Reported on 02/28/2016    Historical Provider, MD  fluconazole (DIFLUCAN) 150 MG tablet 1 po stat; repeat in 3 days Patient not taking: Reported on 02/28/2016 01/01/16   Jacklyn Shell, CNM  ibuprofen (ADVIL,MOTRIN) 600 MG tablet Take 1 tablet (600 mg total) by mouth every 6 (six) hours as needed. 01/08/17   Chase Picket Garvey Westcott, PA-C  magic mouthwash w/lidocaine SOLN Take 10 mLs by mouth 3 (three) times daily as needed for mouth pain. Swish and spit , do not swallow Patient not taking: Reported on 02/28/2016 10/12/15   Tammy Triplett, PA-C  methocarbamol (ROBAXIN) 500 MG tablet Take 1 tablet (500 mg total) by mouth every 6 (six) hours as needed for muscle spasms. Patient not taking: Reported on 02/28/2016 08/18/15   Margart Sickles, PA-C  metroNIDAZOLE (METROGEL VAGINAL) 0.75 % vaginal gel  Place 1 Applicatorful vaginally at bedtime. X 5 nights, no alcohol or sex while using 02/28/16   Cheral MarkerKimberly R Booker, CNM  Norethindrone-Ethinyl Estradiol-Fe Biphas (LO LOESTRIN FE) 1 MG-10 MCG / 10 MCG tablet Take 1 tablet by mouth daily. 02/28/16   Cheral MarkerKimberly R Booker, CNM  omeprazole (PRILOSEC) 20 MG capsule Take 20 mg by mouth daily.    Historical Provider, MD  oxyCODONE-acetaminophen (ROXICET) 5-325 MG per tablet Take 1-2 tablets by mouth  every 4 (four) hours as needed for moderate pain or severe pain. Patient not taking: Reported on 02/28/2016 08/18/15   Margart SicklesJoshua Chadwell, PA-C  Phenyleph-Doxylamine-DM-APAP (NYQUIL SEVERE COLD/FLU) 5-6.25-10-325 MG/15ML LIQD Take 5 mLs by mouth daily as needed (Sore Throat). Reported on 02/28/2016    Historical Provider, MD  pseudoephedrine (SUDAFED) 60 MG tablet Take 1 tablet (60 mg total) by mouth every 4 (four) hours as needed for congestion. Patient not taking: Reported on 02/28/2016 10/12/15   Pauline Ausammy Triplett, PA-C    Family History Family History  Problem Relation Age of Onset  . Depression Mother   . Alcohol abuse Maternal Aunt   . Alcohol abuse Paternal Aunt   . Drug abuse Paternal Aunt   . Mental illness Paternal Uncle   . Diabetes Maternal Grandmother   . Mental illness Paternal Grandfather     Social History Social History  Substance Use Topics  . Smoking status: Current Every Day Smoker    Types: Cigarettes  . Smokeless tobacco: Never Used  . Alcohol use Yes     Allergies   Aripiprazole and Trazodone and nefazodone   Review of Systems Review of Systems  Constitutional: Negative for fever.  HENT:       +jaw pain  Respiratory: Negative for cough.      Physical Exam Updated Vital Signs BP 115/69 (BP Location: Left Arm)   Pulse 65   Temp 98.4 F (36.9 C) (Oral)   Resp 16   LMP 12/18/2016 (Within Days)   SpO2 99%   Physical Exam  Constitutional: She is oriented to person, place, and time. She appears well-developed and well-nourished. No distress.  HENT:  Head: Normocephalic and atraumatic.  Tenderness to palpation of the left TMJ joint with no crepitus or deformity appreciated.   Neck: Normal range of motion. Neck supple.  Cardiovascular: Normal rate, regular rhythm and normal heart sounds.   No murmur heard. Pulmonary/Chest: Effort normal and breath sounds normal. No respiratory distress.  Abdominal: Soft. She exhibits no distension. There is no  tenderness.  Musculoskeletal: She exhibits no edema.  Neurological: She is alert and oriented to person, place, and time.  Skin: Skin is warm and dry.  Nursing note and vitals reviewed.    ED Treatments / Results  DIAGNOSTIC STUDIES: Oxygen Saturation is 99% on RA, normal by my interpretation.    COORDINATION OF CARE: 2:29 PM Discussed treatment plan with pt at bedside and pt agreed to plan.   Labs (all labs ordered are listed, but only abnormal results are displayed) Labs Reviewed - No data to display  EKG  EKG Interpretation None       Radiology No results found.  Procedures Procedures (including critical care time)  Medications Ordered in ED Medications - No data to display   Initial Impression / Assessment and Plan / ED Course  I have reviewed the triage vital signs and the nursing notes.  Pertinent labs & imaging results that were available during my care of the patient were reviewed by me and considered  in my medical decision making (see chart for details).     Rosaline Ezekiel is a 21 y.o. female who presents to ED for Intermittent left TMJ pain. On exam, patient has tenderness to the left TMJ with no crepitus or deformity noted. No overlying skin changes appreciated. Symptomatic home care instructions were discussed. Follow-up with PCP or dentistry if symptoms do not improve. Up-to-date patient education information provided. All questions answered.  Final Clinical Impressions(s) / ED Diagnoses   Final diagnoses:  Arthralgia of left temporomandibular joint    New Prescriptions New Prescriptions   IBUPROFEN (ADVIL,MOTRIN) 600 MG TABLET    Take 1 tablet (600 mg total) by mouth every 6 (six) hours as needed.   I personally performed the services described in this documentation, which was scribed in my presence. The recorded information has been reviewed and is accurate.    [facility] Dak Szumski, PA-C 01/08/17 1445    Rolland Porter, MD 01/18/17 [phone]
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
## 2017-03-13 ENCOUNTER — Other Ambulatory Visit: Payer: Self-pay | Admitting: Advanced Practice Midwife

## 2017-03-13 DIAGNOSIS — Z113 Encounter for screening for infections with a predominantly sexual mode of transmission: Secondary | ICD-10-CM

## 2017-03-13 NOTE — Progress Notes (Signed)
Requests STD screening

## 2017-03-16 LAB — RPR: RPR: NONREACTIVE

## 2017-03-16 LAB — HIV ANTIBODY (ROUTINE TESTING W REFLEX): HIV SCREEN 4TH GENERATION: NONREACTIVE

## 2017-04-03 ENCOUNTER — Other Ambulatory Visit: Payer: Self-pay | Admitting: *Deleted

## 2017-04-03 ENCOUNTER — Other Ambulatory Visit: Payer: Self-pay | Admitting: Advanced Practice Midwife

## 2017-04-03 DIAGNOSIS — A749 Chlamydial infection, unspecified: Secondary | ICD-10-CM

## 2017-04-03 DIAGNOSIS — A549 Gonococcal infection, unspecified: Secondary | ICD-10-CM

## 2017-04-03 MED ORDER — BENZONATATE 200 MG PO CAPS: 200.0000 mg | ORAL_CAPSULE | Freq: Three times a day (TID) | ORAL | 0 refills | Status: DC | PRN

## 2017-04-03 NOTE — Progress Notes (Signed)
tesalon perles for cough

## 2017-04-03 NOTE — Progress Notes (Unsigned)
Tessalon perles for cough/cold

## 2017-04-04 LAB — GC/CHLAMYDIA PROBE AMP
CHLAMYDIA, DNA PROBE: NEGATIVE
NEISSERIA GONORRHOEAE BY PCR: NEGATIVE

## 2017-04-05 ENCOUNTER — Encounter: Payer: Self-pay | Admitting: Advanced Practice Midwife

## 2017-12-05 ENCOUNTER — Other Ambulatory Visit: Payer: Self-pay | Admitting: Advanced Practice Midwife

## 2017-12-05 MED ORDER — OSELTAMIVIR PHOSPHATE 75 MG PO CAPS: 75.0000 mg | ORAL_CAPSULE | Freq: Two times a day (BID) | ORAL | 0 refills | Status: AC

## 2017-12-05 NOTE — Progress Notes (Signed)
tamiflu for fever, sore throat and body aches since last night.

## 2018-09-16 ENCOUNTER — Other Ambulatory Visit: Payer: Self-pay

## 2018-09-16 ENCOUNTER — Encounter (HOSPITAL_COMMUNITY): Payer: Self-pay | Admitting: Emergency Medicine

## 2018-09-16 ENCOUNTER — Emergency Department (HOSPITAL_COMMUNITY)
Admission: EM | Admit: 2018-09-16 | Discharge: 2018-09-16 | Disposition: A | Discharge status: Home / Self Care | Payer: Self-pay | Attending: Emergency Medicine | Admitting: Emergency Medicine

## 2018-09-16 DIAGNOSIS — J029 Acute pharyngitis, unspecified: Secondary | ICD-10-CM | POA: Insufficient documentation

## 2018-09-16 DIAGNOSIS — Z5321 Procedure and treatment not carried out due to patient leaving prior to being seen by health care provider: Secondary | ICD-10-CM | POA: Insufficient documentation

## 2018-09-16 LAB — GROUP A STREP BY PCR: Group A Strep by PCR: NOT DETECTED

## 2018-09-16 NOTE — ED Notes (Signed)
Pt not in waiting area 

## 2018-09-16 NOTE — ED Notes (Signed)
Not in waiting area x 2 

## 2018-09-16 NOTE — ED Triage Notes (Signed)
Pt c/o sore throat x 5 days and abscess to right breast x 4 days.

## 2020-08-13 ENCOUNTER — Encounter: Payer: Self-pay | Admitting: Adult Health

## 2020-08-26 ENCOUNTER — Other Ambulatory Visit: Payer: Self-pay | Admitting: Advanced Practice Midwife

## 2020-08-26 MED ORDER — NORGESTIMATE-ETH ESTRADIOL 0.25-35 MG-MCG PO TABS: 1.0000 | ORAL_TABLET | Freq: Every day | ORAL | 11 refills | Status: AC

## 2020-08-26 NOTE — Progress Notes (Signed)
S/P TAB 10 days ago.  Has rx for Nuva Ring, but wants a more discreet form of contraception. BPs normal per exam at [facility]. Birth control options discussed:  COCs, depo, nuva ring, Nexplanon, IUD (hormonal and nonhormonal), and patch. Risks/benefits/side effects of each discussed.  Pt chooses COCs.  Sprintec rx sent.  Start today, no intercourse for 14 days.

## 2022-01-25 ENCOUNTER — Other Ambulatory Visit: Payer: Self-pay

## 2022-01-25 ENCOUNTER — Emergency Department (HOSPITAL_COMMUNITY)
Admission: EM | Admit: 2022-01-25 | Discharge: 2022-01-25 | Disposition: A | Discharge status: Home / Self Care | Payer: Self-pay | Attending: Emergency Medicine | Admitting: Emergency Medicine

## 2022-01-25 ENCOUNTER — Encounter (HOSPITAL_COMMUNITY): Payer: Self-pay | Admitting: Emergency Medicine

## 2022-01-25 DIAGNOSIS — N12 Tubulo-interstitial nephritis, not specified as acute or chronic: Secondary | ICD-10-CM | POA: Insufficient documentation

## 2022-01-25 LAB — URINALYSIS, ROUTINE W REFLEX MICROSCOPIC
Bilirubin Urine: NEGATIVE
Glucose, UA: NEGATIVE mg/dL
Ketones, ur: NEGATIVE mg/dL
Nitrite: NEGATIVE
Protein, ur: 30 mg/dL — AB
Specific Gravity, Urine: 1.005 (ref 1.005–1.030)
WBC, UA: >50 WBC/hpf — ABNORMAL HIGH (ref 0–5)
pH: 8 (ref 5.0–8.0)

## 2022-01-25 LAB — GC/CHLAMYDIA PROBE AMP (~~LOC~~) NOT AT ARMC
Chlamydia: NEGATIVE
Comment: NEGATIVE
Comment: NORMAL
Neisseria Gonorrhea: NEGATIVE

## 2022-01-25 LAB — PREGNANCY, URINE: Preg Test, Ur: NEGATIVE

## 2022-01-25 LAB — POC URINE PREG, ED: Preg Test, Ur: NEGATIVE

## 2022-01-25 MED ORDER — CEPHALEXIN 500 MG PO CAPS: 500.0000 mg | ORAL_CAPSULE | Freq: Four times a day (QID) | ORAL | 0 refills | Status: DC

## 2022-01-25 MED ORDER — IBUPROFEN 400 MG PO TABS
400.0000 mg | ORAL_TABLET | Freq: Once | ORAL | Status: AC
  Administered 2022-01-25: 400 mg via ORAL
  Filled 2022-01-25: qty 1

## 2022-01-25 MED ORDER — CEPHALEXIN 500 MG PO CAPS
500.0000 mg | ORAL_CAPSULE | Freq: Once | ORAL | Status: AC
  Administered 2022-01-25: 500 mg via ORAL
  Filled 2022-01-25: qty 1

## 2022-01-25 NOTE — ED Triage Notes (Signed)
Pt states she was treated for uti 2 weeks ago now she is having lower back pain, fever and chills.

## 2022-01-25 NOTE — Discharge Instructions (Signed)
If you take any birth control, please use condoms or other forms of contraception as antibiotics can decrease the effect of the birth control

## 2022-01-25 NOTE — ED Provider Notes (Signed)
[facility] EMERGENCY DEPARTMENT Provider Note   CSN: [id number] Arrival date & time: 01/25/22  0042     History  Chief Complaint  Patient presents with   Fever    Kristen Holmes is a 26 y.o. female.  The history is provided by the patient.  Urinary Frequency This is a new problem. The current episode started more than 1 week ago. The problem occurs daily. The problem has been gradually worsening. Associated symptoms include abdominal pain. Nothing aggravates the symptoms. Nothing relieves the symptoms.  Patient is otherwise healthy.  She reports of the past week she has had urinary frequency and dysuria Over the past days she is now having left flank pain and fever up to 101. She took meds for fever prior to arrival.  She has been treating her symptoms at home and has not been on antibiotics.    Home Medications Prior to Admission medications   Medication Sig Start Date End Date Taking? Authorizing Provider  cephALEXin (KEFLEX) 500 MG capsule Take 1 capsule (500 mg total) by mouth 4 (four) times daily. 01/25/22  Yes Zadie Rhine, MD  norgestimate-ethinyl estradiol (ORTHO-CYCLEN) 0.25-35 MG-MCG tablet Take 1 tablet by mouth daily. 08/26/20   Cresenzo-Dishmon, Scarlette Calico, CNM  omeprazole (PRILOSEC) 20 MG capsule Take 20 mg by mouth daily.    [provider]      Allergies    Aripiprazole and Trazodone and nefazodone    Review of Systems   Review of Systems  Constitutional:  Positive for fatigue and fever.  Respiratory:  Negative for cough.   Gastrointestinal:  Positive for abdominal pain. Negative for vomiting.  Genitourinary:  Positive for dysuria, flank pain and frequency. Negative for vaginal bleeding and vaginal discharge.  All other systems reviewed and are negative.  Physical Exam Updated Vital Signs BP 113/68    Pulse 91    Temp 98.8 F (37.1 C) (Oral)    Resp 17    Ht 1.575 m (5\' 2" )    Wt 82.1 kg    LMP 01/11/2022    SpO2 92%    BMI 33.11 kg/m  Physical  Exam CONSTITUTIONAL: Well developed/well nourished HEAD: Normocephalic/atraumatic EYES: EOMI/PERRL ENMT: Mucous membranes moist NECK: supple no meningeal signs SPINE/BACK:entire spine nontender CV: S1/S2 noted, no murmurs/rubs/gallops noted LUNGS: Lungs are clear to auscultation bilaterally, no apparent distress ABDOMEN: soft, nontender, no rebound or guarding, bowel sounds noted throughout abdomen GU: Left cva tenderness NEURO: Pt is awake/alert/appropriate, moves all extremitiesx4.  No facial droop.   EXTREMITIES: pulses normal/equal, full ROM SKIN: warm, color normal PSYCH: no abnormalities of mood noted, alert and oriented to situation  ED Results / Procedures / Treatments   Labs (all labs ordered are listed, but only abnormal results are displayed) Labs Reviewed  URINALYSIS, ROUTINE W REFLEX MICROSCOPIC - Abnormal; Notable for the following components:      Result Value   APPearance CLOUDY (*)    Hgb urine dipstick MODERATE (*)    Protein, ur 30 (*)    Leukocytes,Ua LARGE (*)    WBC, UA >50 (*)    Bacteria, UA MANY (*)    All other components within normal limits  PREGNANCY, URINE  POC URINE PREG, ED  GC/CHLAMYDIA PROBE AMP () NOT AT [facility]    EKG None  Radiology No results found.  Procedures Procedures    Medications Ordered in ED Medications  ibuprofen (ADVIL) tablet 400 mg (400 mg Oral Given 01/25/22 0207)  cephALEXin (KEFLEX) capsule 500 mg (500 mg  Oral Given 01/25/22 0207)    ED Course/ Medical Decision Making/ A&P                           Medical Decision Making Amount and/or Complexity of Data Reviewed Labs: ordered. Decision-making details documented in ED Course. ECG/medicine tests: ordered. Decision-making details documented in ED Course.  Risk Prescription drug management.   Patient is otherwise healthy presenting with urinary symptoms for 2 weeks now having signs of pyelonephritis.  She reports she was febrile at home with left  flank pain.  On my exam patient is in no acute distress and is not septic appearing.  She declines IV medications at this time.  She prefers to try oral medications.  This seems to be a reasonable plan.  Urinalysis reveals obvious UTI.  She is not pregnant.  She also requests STD testing, but denies any vaginal bleeding or discharge She is safe for outpatient management.  We discussed strict ER return precautions BP 113/68    Pulse 85    Temp 98.8 F (37.1 C) (Oral)    Resp 17    Ht 1.575 m (5\' 2" )    Wt 82.1 kg    LMP 01/11/2022    SpO2 96%    BMI 33.11 kg/m         Final Clinical Impression(s) / ED Diagnoses Final diagnoses:  Pyelonephritis    Rx / DC Orders ED Discharge Orders          Ordered    cephALEXin (KEFLEX) 500 MG capsule  4 times daily        01/25/22 0157              03/25/22, MD 01/25/22 [phone]

## 2023-06-27 ENCOUNTER — Ambulatory Visit: Payer: Self-pay | Admitting: Family Medicine

## 2023-09-24 DIAGNOSIS — Z139 Encounter for screening, unspecified: Secondary | ICD-10-CM

## 2023-09-24 LAB — GLUCOSE, POCT (MANUAL RESULT ENTRY): POC Glucose: 91 mg/dL (ref 70–99)

## 2023-09-24 NOTE — Congregational Nurse Program (Signed)
Pt attended [employer] [facility] to complete enrollment into the [facility] Uninsured Program of [place] on today (10.7.24) and referred pt to me for health screenings and to receive assistance with getting connected to a PCP.   Pt states she has not see a PCP since 2018, but did attend the ER for kidney infection in 2023.  Pt denies any urgent medical needs, outside of wanting to establish care.  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      Chief Reasons Needed for  PCP -Requesting annual well check to establish care with PCP   Past /Current Medical History -Reports hx Anxiety, ADHD and Acid Reflux   States currently is not taking any medications for the above conditions, due to the side effects that she disliked outside of acid reflux OTC meds  Vital Signs Checked  (see additional readings in vitals section of chart) POCT Glucose Screening Glucose=WNL (91) nonfasting BP (WNL)  Socio-determinants health needs identified: -No urgent [facility] needs at this time according to pt, although housing and food was mentioned in questionnaire as an issue within the last 12 months.   (Pt was toured the [employer] in the event she may need in the future as well as provided a [employer] [place] in the event she may need additional resources)   PLAN   Pt completed on today with Care Connect Enrollment Specialist/Care Guide Kathline Magic. - Enrollment completed Care Connect Program.      Care Connect Eligibile (10.7.24 thru 10.7.25) -  Pt will be provided continous nurse case management upon       completion of followup of first medical appointment  with [facility] [place]   -Scheduled Appointment for first medical visit was scheduled for Thursday, Sep 27, 2023 at 11am at the [facility].

## 2023-09-27 ENCOUNTER — Ambulatory Visit: Payer: Self-pay | Admitting: Physician Assistant

## 2023-10-02 ENCOUNTER — Ambulatory Visit: Payer: Self-pay | Admitting: Physician Assistant

## 2023-10-24 ENCOUNTER — Telehealth: Payer: MEDICAID | Admitting: Physician Assistant

## 2023-10-24 DIAGNOSIS — R3989 Other symptoms and signs involving the genitourinary system: Secondary | ICD-10-CM

## 2023-10-24 MED ORDER — CEPHALEXIN 500 MG PO CAPS: 500.0000 mg | ORAL_CAPSULE | Freq: Two times a day (BID) | ORAL | 0 refills | Status: AC

## 2023-10-24 NOTE — Progress Notes (Signed)
I have spent 5 minutes in review of e-visit questionnaire, review and updating patient chart, medical decision making and response to patient.   Mia Milan Cody Jacklynn Dehaas, PA-C    

## 2023-10-24 NOTE — Progress Notes (Addendum)

## 2023-11-14 ENCOUNTER — Telehealth: Payer: MEDICAID | Admitting: Nurse Practitioner

## 2023-11-14 DIAGNOSIS — B9789 Other viral agents as the cause of diseases classified elsewhere: Secondary | ICD-10-CM | POA: Diagnosis not present

## 2023-11-14 DIAGNOSIS — J329 Chronic sinusitis, unspecified: Secondary | ICD-10-CM

## 2023-11-14 MED ORDER — IPRATROPIUM BROMIDE 0.03 % NA SOLN: 2.0000 | Freq: Two times a day (BID) | NASAL | 12 refills | Status: AC

## 2023-11-14 NOTE — Progress Notes (Signed)
E-Visit for Sinus Problems  We are sorry that you are not feeling well.  Here is how we plan to help!  Based on what you have shared with me it looks like you have sinusitis.  Sinusitis is inflammation and infection in the sinus cavities of the head.  Based on your presentation I believe you most likely have Acute Viral Sinusitis.This is an infection most likely caused by a virus. There is not specific treatment for viral sinusitis other than to help you with the symptoms until the infection runs its course.  You may use an oral decongestant such as Mucinex D or if you have glaucoma or high blood pressure use plain Mucinex. Saline nasal spray help and can safely be used as often as needed for congestion, I have prescribed: Ipratropium Bromide nasal spray 0.03% 2 sprays in eah nostril 2-3 times a day  We do not recommend antibiotics prior to 7-10 days of consistent symptoms.  Providers prescribe antibiotics to treat infections caused by bacteria. Antibiotics are very powerful in treating bacterial infections when they are used properly. To maintain their effectiveness, they should be used only when necessary. Overuse of antibiotics has resulted in the development of superbugs that are resistant to treatment!    After careful review of your answers, I would not recommend an antibiotic for your condition.  Antibiotics are not effective against viruses and therefore should not be used to treat them. Common examples of infections caused by viruses include colds and flu  Some authorities believe that zinc sprays or the use of Echinacea may shorten the course of your symptoms.  Sinus infections are not as easily transmitted as other respiratory infection, however we still recommend that you avoid close contact with loved ones, especially the very young and elderly.  Remember to wash your hands thoroughly throughout the day as this is the number one way to prevent the spread of infection!  Home Care: Only  take medications as instructed by your medical team. Do not take these medications with alcohol. A steam or ultrasonic humidifier can help congestion.  You can place a towel over your head and breathe in the steam from hot water coming from a faucet. Avoid close contacts especially the very young and the elderly. Cover your mouth when you cough or sneeze. Always remember to wash your hands.  Get Help Right Away If: You develop worsening fever or sinus pain. You develop a severe head ache or visual changes. Your symptoms persist after you have completed your treatment plan.  Make sure you Understand these instructions. Will watch your condition. Will get help right away if you are not doing well or get worse.   Thank you for choosing an e-visit.  Your e-visit answers were reviewed by a board certified advanced clinical practitioner to complete your personal care plan. Depending upon the condition, your plan could have included both over the counter or prescription medications.  Please review your pharmacy choice. Make sure the pharmacy is open so you can pick up prescription now. If there is a problem, you may contact your provider through [employer] and have the prescription routed to another pharmacy.  Your safety is important to [place]. If you have drug allergies check your prescription carefully.   For the next 24 hours you can use MyChart to ask questions about today's visit, request a non-urgent call back, or ask for a work or school excuse. You will get an email in the next two days asking about  your experience. I hope that your e-visit has been valuable and will speed your recovery.  Meds ordered this encounter  Medications   ipratropium (ATROVENT) 0.03 % nasal spray    Sig: Place 2 sprays into both nostrils every 12 (twelve) hours.    Dispense:  30 mL    Refill:  12    I spent approximately 5 minutes reviewing the patient's history, current symptoms and coordinating their  care today.

## 2023-11-23 ENCOUNTER — Telehealth: Payer: MEDICAID | Admitting: Physician Assistant

## 2023-11-23 DIAGNOSIS — K047 Periapical abscess without sinus: Secondary | ICD-10-CM | POA: Diagnosis not present

## 2023-11-23 MED ORDER — AMOXICILLIN-POT CLAVULANATE 875-125 MG PO TABS: 1.0000 | ORAL_TABLET | Freq: Two times a day (BID) | ORAL | 0 refills | Status: DC

## 2023-11-23 MED ORDER — IBUPROFEN 600 MG PO TABS: 600.0000 mg | ORAL_TABLET | Freq: Three times a day (TID) | ORAL | 0 refills | Status: AC | PRN

## 2023-11-23 NOTE — Progress Notes (Signed)
E-Visit for Dental Pain  We are sorry that you are not feeling well.  Here is how we plan to help!  Based on what you have shared with me in the questionnaire, it sounds like you have a dental infection/abscess.  Augmentin 875-125mg  twice a day for 7 days and Ibuprofen 600mg  3 times a day for 7 days for discomfort  It is imperative that you see a dentist within 10 days of this eVisit to determine the cause of the dental pain and be sure it is adequately treated  A toothache or tooth pain is caused when the nerve in the root of a tooth or surrounding a tooth is irritated. Dental (tooth) infection, decay, injury, or loss of a tooth are the most common causes of dental pain. Pain may also occur after an extraction (tooth is pulled out). Pain sometimes originates from other areas and radiates to the jaw, thus appearing to be tooth pain.Bacteria growing inside your mouth can contribute to gum disease and dental decay, both of which can cause pain. A toothache occurs from inflammation of the central portion of the tooth called pulp. The pulp contains nerve endings that are very sensitive to pain. Inflammation to the pulp or pulpitis may be caused by dental cavities, trauma, and infection.    HOME CARE:   For toothaches: Over-the-counter pain medications such as acetaminophen or ibuprofen may be used. Take these as directed on the package while you arrange for a dental appointment. Avoid very cold or hot foods, because they may make the pain worse. You may get relief from biting on a cotton ball soaked in oil of cloves. You can get oil of cloves at most drug stores.  For jaw pain:  Aspirin may be helpful for problems in the joint of the jaw in adults. If pain happens every time you open your mouth widely, the temporomandibular joint (TMJ) may be the source of the pain. Yawning or taking a large bite of food may worsen the pain. An appointment with your doctor or dentist will help you find the cause.      GET HELP RIGHT AWAY IF:  You have a high fever or chills If you have had a recent head or face injury and develop headache, light headedness, nausea, vomiting, or other symptoms that concern you after an injury to your face or mouth, you could have a more serious injury in addition to your dental injury. A facial rash associated with a toothache: This condition may improve with medication. Contact your doctor for them to decide what is appropriate. Any jaw pain occurring with chest pain: Although jaw pain is most commonly caused by dental disease, it is sometimes referred pain from other areas. People with heart disease, especially people who have had stents placed, people with diabetes, or those who have had heart surgery may have jaw pain as a symptom of heart attack or angina. If your jaw or tooth pain is associated with lightheadedness, sweating, or shortness of breath, you should see a doctor as soon as possible. Trouble swallowing or excessive pain or bleeding from gums: If you have a history of a weakened immune system, diabetes, or steroid use, you may be more susceptible to infections. Infections can often be more severe and extensive or caused by unusual organisms. Dental and gum infections in people with these conditions may require more aggressive treatment. An abscess may need draining or IV antibiotics, for example.  MAKE SURE YOU   Understand these instructions.  Will watch your condition. Will get help right away if you are not doing well or get worse.  Thank you for choosing an e-visit.  Your e-visit answers were reviewed by a board certified advanced clinical practitioner to complete your personal care plan. Depending upon the condition, your plan could have included both over the counter or prescription medications.  Please review your pharmacy choice. Make sure the pharmacy is open so you can pick up prescription now. If there is a problem, you may contact your provider  through [employer] and have the prescription routed to another pharmacy.  Your safety is important to [place]. If you have drug allergies check your prescription carefully.   For the next 24 hours you can use MyChart to ask questions about today's visit, request a non-urgent call back, or ask for a work or school excuse. You will get an email in the next two days asking about your experience. I hope that your e-visit has been valuable and will speed your recovery.   I have spent 5 minutes in review of e-visit questionnaire, review and updating patient chart, medical decision making and response to patient.   Margaretann Loveless, PA-C

## 2024-01-29 ENCOUNTER — Telehealth: Payer: MEDICAID | Admitting: Physician Assistant

## 2024-01-29 DIAGNOSIS — R3989 Other symptoms and signs involving the genitourinary system: Secondary | ICD-10-CM

## 2024-01-29 MED ORDER — CEPHALEXIN 500 MG PO CAPS: 500.0000 mg | ORAL_CAPSULE | Freq: Two times a day (BID) | ORAL | 0 refills | Status: AC

## 2024-01-29 NOTE — Progress Notes (Signed)

## 2024-01-29 NOTE — Progress Notes (Signed)
I have spent 5 minutes in review of e-visit questionnaire, review and updating patient chart, medical decision making and response to patient.   Piedad Climes, PA-C

## 2024-02-28 ENCOUNTER — Telehealth: Payer: MEDICAID | Admitting: Physician Assistant

## 2024-02-28 DIAGNOSIS — K047 Periapical abscess without sinus: Secondary | ICD-10-CM | POA: Diagnosis not present

## 2024-02-28 MED ORDER — AMOXICILLIN-POT CLAVULANATE 875-125 MG PO TABS: 1.0000 | ORAL_TABLET | Freq: Two times a day (BID) | ORAL | 0 refills | Status: DC

## 2024-02-28 NOTE — Progress Notes (Signed)

## 2024-02-28 NOTE — Progress Notes (Signed)
 I have spent 5 minutes in review of e-visit questionnaire, review and updating patient chart, medical decision making and response to patient.   Piedad Climes, PA-C

## 2024-04-01 ENCOUNTER — Ambulatory Visit: Payer: MEDICAID | Admitting: Obstetrics & Gynecology

## 2024-05-13 ENCOUNTER — Emergency Department (HOSPITAL_BASED_OUTPATIENT_CLINIC_OR_DEPARTMENT_OTHER)
Admission: EM | Admit: 2024-05-13 | Discharge: 2024-05-13 | Disposition: A | Discharge status: Home / Self Care | Payer: MEDICAID | Attending: Emergency Medicine | Admitting: Emergency Medicine

## 2024-05-13 ENCOUNTER — Encounter (HOSPITAL_BASED_OUTPATIENT_CLINIC_OR_DEPARTMENT_OTHER): Payer: Self-pay

## 2024-05-13 ENCOUNTER — Emergency Department (HOSPITAL_BASED_OUTPATIENT_CLINIC_OR_DEPARTMENT_OTHER): Payer: MEDICAID

## 2024-05-13 DIAGNOSIS — R1010 Upper abdominal pain, unspecified: Secondary | ICD-10-CM

## 2024-05-13 DIAGNOSIS — F17210 Nicotine dependence, cigarettes, uncomplicated: Secondary | ICD-10-CM | POA: Insufficient documentation

## 2024-05-13 DIAGNOSIS — D72829 Elevated white blood cell count, unspecified: Secondary | ICD-10-CM | POA: Diagnosis not present

## 2024-05-13 DIAGNOSIS — R1011 Right upper quadrant pain: Secondary | ICD-10-CM | POA: Insufficient documentation

## 2024-05-13 DIAGNOSIS — R112 Nausea with vomiting, unspecified: Secondary | ICD-10-CM | POA: Diagnosis present

## 2024-05-13 LAB — CBC
HCT: 46.8 % — ABNORMAL HIGH (ref 36.0–46.0)
Hemoglobin: 16 g/dL — ABNORMAL HIGH (ref 12.0–15.0)
MCH: 30.4 pg (ref 26.0–34.0)
MCHC: 34.2 g/dL (ref 30.0–36.0)
MCV: 88.8 fL (ref 80.0–100.0)
Platelets: 298 10*3/uL (ref 150–400)
RBC: 5.27 MIL/uL — ABNORMAL HIGH (ref 3.87–5.11)
RDW: 12.3 % (ref 11.5–15.5)
WBC: 13.4 10*3/uL — ABNORMAL HIGH (ref 4.0–10.5)
nRBC: 0 % (ref 0.0–0.2)

## 2024-05-13 LAB — COMPREHENSIVE METABOLIC PANEL WITH GFR
ALT: 10 U/L (ref 0–44)
AST: 19 U/L (ref 15–41)
Albumin: 4.7 g/dL (ref 3.5–5.0)
Alkaline Phosphatase: 58 U/L (ref 38–126)
Anion gap: 15 (ref 5–15)
BUN: 8 mg/dL (ref 6–20)
CO2: 20 mmol/L — ABNORMAL LOW (ref 22–32)
Calcium: 10.2 mg/dL (ref 8.9–10.3)
Chloride: 104 mmol/L (ref 98–111)
Creatinine, Ser: 0.83 mg/dL (ref 0.44–1.00)
GFR, Estimated: >60 mL/min (ref >60)
Glucose, Bld: 94 mg/dL (ref 70–99)
Potassium: 4.3 mmol/L (ref 3.5–5.1)
Sodium: 138 mmol/L (ref 135–145)
Total Bilirubin: 0.4 mg/dL (ref 0.0–1.2)
Total Protein: 8 g/dL (ref 6.5–8.1)

## 2024-05-13 LAB — URINALYSIS, ROUTINE W REFLEX MICROSCOPIC
Glucose, UA: NEGATIVE mg/dL
Hgb urine dipstick: NEGATIVE
Nitrite: NEGATIVE
Protein, ur: 30 mg/dL — AB
Specific Gravity, Urine: 1.042 — ABNORMAL HIGH (ref 1.005–1.030)
pH: 5.5 (ref 5.0–8.0)

## 2024-05-13 LAB — HCG, QUANTITATIVE, PREGNANCY: hCG, Beta Chain, Quant, S: <1 m[IU]/mL (ref <5)

## 2024-05-13 LAB — LIPASE, BLOOD: Lipase: 13 U/L (ref 11–51)

## 2024-05-13 MED ORDER — ONDANSETRON HCL 4 MG/2ML IJ SOLN
4.0000 mg | Freq: Once | INTRAMUSCULAR | Status: AC
  Administered 2024-05-13: 4 mg via INTRAVENOUS
  Filled 2024-05-13: qty 2

## 2024-05-13 MED ORDER — SODIUM CHLORIDE 0.9 % IV BOLUS
1000.0000 mL | Freq: Once | INTRAVENOUS | Status: AC
  Administered 2024-05-13: 1000 mL via INTRAVENOUS

## 2024-05-13 MED ORDER — CEFADROXIL 500 MG PO CAPS: 500.0000 mg | ORAL_CAPSULE | Freq: Two times a day (BID) | ORAL | 0 refills | Status: DC

## 2024-05-13 MED ORDER — ONDANSETRON 4 MG PO TBDP: 4.0000 mg | ORAL_TABLET | Freq: Three times a day (TID) | ORAL | 0 refills | Status: AC | PRN

## 2024-05-13 MED ORDER — FAMOTIDINE 20 MG PO TABS: 20.0000 mg | ORAL_TABLET | Freq: Two times a day (BID) | ORAL | 0 refills | Status: AC | PRN

## 2024-05-13 NOTE — Discharge Instructions (Addendum)
 As discussed, your workup today was overall reassuring.  Urine did look like it was possible infected so we will treat you with antibiotics for this.  Rest your labs do not show any obvious emergent abnormality.  Ultrasound of your right upper abdomen showed normal-appearing liver as well as gallbladder.  Will send with nausea medicine as well as reflux medicine for treatment of presumed pain from your stomach.  Recommend follow-up with your primary care for reassessment of your symptoms.  Please do not hesitate to return to the emergency department if the worrisome signs and symptoms we discussed become apparent.

## 2024-05-13 NOTE — ED Triage Notes (Signed)
 States vomiting started this am after taking medication.  Went to methadone clinic this am but was unable to keep medications down.  She states she is afraid she "got into some bad drugs"  Present with emeses on shirt.

## 2024-05-13 NOTE — ED Provider Notes (Signed)
 [place] EMERGENCY DEPARTMENT AT [facility] Provider Note   CSN: [id number] Arrival date & time: 05/13/24  1012     History  Chief Complaint  Patient presents with   Emesis    Kristen Holmes is a 28 y.o. female.   Emesis   28 year old female presents emergency department with complaints of upper abdominal pain, nausea, vomiting.  States that she used fentanyl  intranasally this morning and subsequently developed symptoms.  Reports pain mainly in right upper abdomen without radiation with several episodes of emesis.  Was on her way to methadone clinic but was unable to keep down medications prompting visit to the emergency department.  States that she is concerned that she may have "gotten some bad drugs" that she consumed this morning.  Denies any fevers, chills, hematemesis, urinary symptoms, change in bowel habits.  Denies history abdominal surgeries.  Past medical history significant for ADHD, alcohol abuse, polysubstance use  Home Medications Prior to Admission medications   Medication Sig Start Date End Date Taking? Authorizing Provider  amoxicillin -clavulanate (AUGMENTIN ) 875-125 MG tablet Take 1 tablet by mouth 2 (two) times daily. 02/28/24   Farris Hong, PA-C  ibuprofen  (ADVIL ) 600 MG tablet Take 1 tablet (600 mg total) by mouth every 8 (eight) hours as needed. 11/23/23   Angelia Kelp, PA-C  ipratropium (ATROVENT ) 0.03 % nasal spray Place 2 sprays into both nostrils every 12 (twelve) hours. 11/14/23   Mardene Shake, FNP  norgestimate -ethinyl estradiol  (ORTHO-CYCLEN) 0.25-35 MG-MCG tablet Take 1 tablet by mouth daily. 08/26/20   Cresenzo-Dishmon, Blanca Bunch, CNM  omeprazole (PRILOSEC) 20 MG capsule Take 20 mg by mouth daily.    [provider]      Allergies    Aripiprazole and Trazodone and nefazodone    Review of Systems   Review of Systems  Gastrointestinal:  Positive for vomiting.  All other systems reviewed and are negative.   Physical  Exam Updated Vital Signs BP 109/74 (BP Location: Right Arm)   Pulse 63   Temp 98.3 F (36.8 C) (Oral)   Resp 18   Ht 5\' 2"  (1.575 m)   Wt 84.4 kg   LMP 04/08/2024 (Approximate)   SpO2 93%   BMI 34.02 kg/m  Physical Exam Vitals and nursing note reviewed.  Constitutional:      General: She is not in acute distress.    Appearance: She is well-developed.  HENT:     Head: Normocephalic and atraumatic.  Eyes:     Conjunctiva/sclera: Conjunctivae normal.  Cardiovascular:     Rate and Rhythm: Normal rate and regular rhythm.     Heart sounds: No murmur heard. Pulmonary:     Effort: Pulmonary effort is normal. No respiratory distress.     Breath sounds: Normal breath sounds.  Abdominal:     Palpations: Abdomen is soft.     Tenderness: There is abdominal tenderness in the right upper quadrant and epigastric area. There is no right CVA tenderness or left CVA tenderness.  Musculoskeletal:        General: No swelling.     Cervical back: Neck supple.  Skin:    General: Skin is warm and dry.     Capillary Refill: Capillary refill takes less than 2 seconds.  Neurological:     Mental Status: She is alert.  Psychiatric:        Mood and Affect: Mood normal.    ED Results / Procedures / Treatments   Labs (all labs ordered are listed, but only abnormal  results are displayed) Labs Reviewed  HCG, QUANTITATIVE, PREGNANCY  CBC  COMPREHENSIVE METABOLIC PANEL WITH GFR  LIPASE, BLOOD  URINALYSIS, ROUTINE W REFLEX MICROSCOPIC    EKG None  Radiology No results found.  Procedures Procedures    Medications Ordered in ED Medications  sodium chloride  0.9 % bolus 1,000 mL (1,000 mLs Intravenous New Bag/Given 05/13/24 1051)  ondansetron  (ZOFRAN ) injection 4 mg (4 mg Intravenous Given 05/13/24 1049)    ED Course/ Medical Decision Making/ A&P Clinical Course as of 05/13/24 1056  Tue May 13, 2024  [address]  [CR]    Clinical Course User Index [CR] [place] Butter, [place]                                  Medical Decision Making Amount and/or Complexity of Data Reviewed Labs: ordered. Radiology: ordered.  Risk Prescription drug management.   This patient presents to the ED for concern of abdominal pain, nausea, vomiting, this involves an extensive number of treatment options, and is a complaint that carries with it a high risk of complications and morbidity.  The differential diagnosis includes medication sinusitis fast withdrawal, substance withdrawal/abuse, cholecystitis, CBD pathology, gastritis, PUD, SBO/LBO, volvulus, diverticulitis, appendicitis, pyelonephritis, nephrolithiasis, other   Co morbidities that complicate the patient evaluation  See HPI   Additional history obtained:  Additional history obtained from EMR External records from outside source obtained and reviewed including hospital records   Lab Tests:  I Ordered, and personally interpreted labs.  The pertinent results include: Leukocytosis of 13.4, polycythemia hemoglobin of 16, platelets of 298.  Decrease in bicarb of 20 otherwise, electrolytes within normal limits.  No transaminitis.  No dysfunction.  hCG negative.  Lipase within normal limits.  UA contaminated sample of 11-20 squamous epithelial cells.  Bacteria, evidence 20 WBCs with moderate leuks.  UA also with 30 proteins, trace ketones, small bilirubin.   Imaging Studies ordered:  I ordered imaging studies including right upper quadrant ultrasound I independently visualized and interpreted imaging which showed no cholecystitis or cholecystitis. I agree with the radiologist interpretation   Cardiac Monitoring: / EKG:  The patient was maintained on a cardiac monitor.  I personally viewed and interpreted the cardiac monitored which showed an underlying rhythm of: sinus rhythm   Consultations Obtained:  Na/   Problem List / ED Course / Critical interventions / Medication management  Upper abdominal pain, nausea,  vomiting I ordered medication including Zofran , normal saline  Reevaluation of the patient after these medicines showed that the patient improved I have reviewed the patients home medicines and have made adjustments as needed   Social Determinants of Health:  Cigarette use.  Denies illicit drug use.   Test / Admission - Considered:  Upper abdominal pain, nausea, vomiting Vitals signs within normal range and stable throughout visit. Laboratory/imaging studies significant for: see above 28 year old female presents emergency department with complaints of upper abdominal pain, nausea, vomiting.  States that she used fentanyl  intranasally this morning and subsequently developed symptoms.  Reports pain mainly in right upper abdomen without radiation with several episodes of emesis.  Was on her way to methadone clinic but was unable to keep down medications prompting visit to the emergency department.  States that she is concerned that she may have "gotten some bad drugs" that she consumed this morning.  Denies any fevers, chills, hematemesis, urinary symptoms, change in bowel habits.  Denies history abdominal surgeries.  On exam, reproducible tenderness mainly in epigastric region with some right upper quadrant.  Labs without acute emergent process.  UA with possible infection although contaminated sample.  Right upper quadrant ultrasound was performed given slight right upper quadrant tenderness which was negative for any acute abnormality.  Patient treated with antiemetic with IV fluids and noted significant improvement of symptoms underwent tolerance of p.o.  Suspect patient's symptoms likely secondary to consumption of substance ingested today.  Patient subsequently observed for nearly 5 hours and cardiac monitoring without any deterioration and steady improvement.  Will recommend symptomatic therapy as described in AVS with close follow-up with PCP in the outpatient setting.  Treatment plan discussed  with patient and she acknowledged understanding was agreeable to said plan.  Patient overall well-appearing, afebrile in no acute distress. Worrisome signs and symptoms were discussed with the patient, and the patient acknowledged understanding to return to the ED if noticed. Patient was stable upon discharge.          Final Clinical Impression(s) / ED Diagnoses Final diagnoses:  None    Rx / DC Orders ED Discharge Orders     None         [place] Butter, [place] 05/13/24 1501    Rosealee Concha, MD 05/13/24 [phone]

## 2024-05-31 ENCOUNTER — Telehealth: Payer: MEDICAID | Admitting: Family

## 2024-05-31 DIAGNOSIS — K047 Periapical abscess without sinus: Secondary | ICD-10-CM

## 2024-05-31 MED ORDER — AMOXICILLIN-POT CLAVULANATE 875-125 MG PO TABS: 1.0000 | ORAL_TABLET | Freq: Two times a day (BID) | ORAL | 0 refills | Status: DC

## 2024-05-31 NOTE — Progress Notes (Signed)
 E-Visit for Dental Pain  We are sorry that you are not feeling well.  Here is how we plan to help!  Based on what you have shared with me in the questionnaire, it sounds like you have dental abscess.   Augmentin  875-125mg  twice a day for 10 days. This is safe to take with xanax   It is imperative that you see a dentist within 10 days of this eVisit to determine the cause of the dental pain and be sure it is adequately treated  A toothache or tooth pain is caused when the nerve in the root of a tooth or surrounding a tooth is irritated. Dental (tooth) infection, decay, injury, or loss of a tooth are the most common causes of dental pain. Pain may also occur after an extraction (tooth is pulled out). Pain sometimes originates from other areas and radiates to the jaw, thus appearing to be tooth pain.Bacteria growing inside your mouth can contribute to gum disease and dental decay, both of which can cause pain. A toothache occurs from inflammation of the central portion of the tooth called pulp. The pulp contains nerve endings that are very sensitive to pain. Inflammation to the pulp or pulpitis may be caused by dental cavities, trauma, and infection.    HOME CARE:   For toothaches: Over-the-counter pain medications such as acetaminophen  or ibuprofen  may be used. Take these as directed on the package while you arrange for a dental appointment. Avoid very cold or hot foods, because they may make the pain worse. You may get relief from biting on a cotton ball soaked in oil of cloves. You can get oil of cloves at most drug stores.  For jaw pain:  Aspirin may be helpful for problems in the joint of the jaw in adults. If pain happens every time you open your mouth widely, the temporomandibular joint (TMJ) may be the source of the pain. Yawning or taking a large bite of food may worsen the pain. An appointment with your doctor or dentist will help you find the cause.     GET HELP RIGHT AWAY  IF:  You have a high fever or chills If you have had a recent head or face injury and develop headache, light headedness, nausea, vomiting, or other symptoms that concern you after an injury to your face or mouth, you could have a more serious injury in addition to your dental injury. A facial rash associated with a toothache: This condition may improve with medication. Contact your doctor for them to decide what is appropriate. Any jaw pain occurring with chest pain: Although jaw pain is most commonly caused by dental disease, it is sometimes referred pain from other areas. People with heart disease, especially people who have had stents placed, people with diabetes, or those who have had heart surgery may have jaw pain as a symptom of heart attack or angina. If your jaw or tooth pain is associated with lightheadedness, sweating, or shortness of breath, you should see a doctor as soon as possible. Trouble swallowing or excessive pain or bleeding from gums: If you have a history of a weakened immune system, diabetes, or steroid use, you may be more susceptible to infections. Infections can often be more severe and extensive or caused by unusual organisms. Dental and gum infections in people with these conditions may require more aggressive treatment. An abscess may need draining or IV antibiotics, for example.  MAKE SURE YOU   Understand these instructions. Will watch your condition.  Will get help right away if you are not doing well or get worse.  Thank you for choosing an e-visit.  Your e-visit answers were reviewed by a board certified advanced clinical practitioner to complete your personal care plan. Depending upon the condition, your plan could have included both over the counter or prescription medications.  Please review your pharmacy choice. Make sure the pharmacy is open so you can pick up prescription now. If there is a problem, you may contact your provider through [employer] and  have the prescription routed to another pharmacy.  Your safety is important to us . If you have drug allergies check your prescription carefully.   For the next 24 hours you can use MyChart to ask questions about today's visit, request a non-urgent call back, or ask for a work or school excuse. You will get an email in the next two days asking about your experience. I hope that your e-visit has been valuable and will speed your recovery.   Approximately 5 minutes was spent documenting and reviewing patient's chart.

## 2024-07-07 ENCOUNTER — Telehealth: Payer: MEDICAID | Admitting: Family Medicine

## 2024-07-07 DIAGNOSIS — R3989 Other symptoms and signs involving the genitourinary system: Secondary | ICD-10-CM | POA: Diagnosis not present

## 2024-07-07 MED ORDER — NITROFURANTOIN MONOHYD MACRO 100 MG PO CAPS: 100.0000 mg | ORAL_CAPSULE | Freq: Two times a day (BID) | ORAL | 0 refills | Status: AC

## 2024-07-07 NOTE — Progress Notes (Signed)

## 2024-09-15 ENCOUNTER — Telehealth: Payer: MEDICAID | Admitting: Family Medicine

## 2024-09-15 DIAGNOSIS — B9689 Other specified bacterial agents as the cause of diseases classified elsewhere: Secondary | ICD-10-CM | POA: Diagnosis not present

## 2024-09-15 DIAGNOSIS — N760 Acute vaginitis: Secondary | ICD-10-CM

## 2024-09-15 DIAGNOSIS — K047 Periapical abscess without sinus: Secondary | ICD-10-CM

## 2024-09-15 MED ORDER — METRONIDAZOLE 500 MG PO TABS: 500.0000 mg | ORAL_TABLET | Freq: Two times a day (BID) | ORAL | 0 refills | Status: AC

## 2024-09-15 MED ORDER — PENICILLIN V POTASSIUM 500 MG PO TABS: 500.0000 mg | ORAL_TABLET | Freq: Three times a day (TID) | ORAL | 0 refills | Status: AC

## 2024-09-15 NOTE — Progress Notes (Signed)

## 2024-09-15 NOTE — Progress Notes (Signed)

## 2024-11-26 ENCOUNTER — Telehealth: Payer: MEDICAID | Admitting: Family Medicine

## 2024-11-26 DIAGNOSIS — R3989 Other symptoms and signs involving the genitourinary system: Secondary | ICD-10-CM

## 2024-11-26 MED ORDER — CEPHALEXIN 500 MG PO CAPS: 500.0000 mg | ORAL_CAPSULE | Freq: Two times a day (BID) | ORAL | 0 refills | Status: AC

## 2024-11-26 NOTE — Progress Notes (Signed)

## 2024-12-01 ENCOUNTER — Encounter: Payer: MEDICAID | Admitting: Adult Health
